# Patient Record
Sex: Female | Born: 1980 | Race: Black or African American | Hispanic: No | Marital: Single | Smoking: Current every day smoker
Health system: Southern US, Community
[De-identification: ages and names within clinical notes are randomized; demographics above are authoritative.]

## PROBLEM LIST (undated history)

## (undated) DIAGNOSIS — I10 Essential (primary) hypertension: Secondary | ICD-10-CM

## (undated) HISTORY — PX: TUBAL LIGATION: SHX77

## (undated) HISTORY — PX: TONSILLECTOMY: SUR1361

## (undated) HISTORY — PX: ABDOMINAL HYSTERECTOMY: SHX81

## (undated) HISTORY — PX: OTHER SURGICAL HISTORY: SHX169

---

## 2002-09-22 HISTORY — PX: TONSILLECTOMY: SUR1361

## 2007-01-04 ENCOUNTER — Ambulatory Visit (HOSPITAL_COMMUNITY): Admission: RE | Admit: 2007-01-04 | Discharge: 2007-01-04 | Payer: Self-pay | Admitting: Obstetrics and Gynecology

## 2010-09-22 HISTORY — PX: ABDOMINAL HYSTERECTOMY: SHX81

## 2010-12-20 ENCOUNTER — Emergency Department (HOSPITAL_BASED_OUTPATIENT_CLINIC_OR_DEPARTMENT_OTHER)
Admission: EM | Admit: 2010-12-20 | Discharge: 2010-12-20 | Disposition: A | Discharge status: Home / Self Care | Payer: Self-pay | Attending: Emergency Medicine | Admitting: Emergency Medicine

## 2010-12-20 DIAGNOSIS — R51 Headache: Secondary | ICD-10-CM | POA: Insufficient documentation

## 2010-12-20 DIAGNOSIS — R109 Unspecified abdominal pain: Secondary | ICD-10-CM | POA: Insufficient documentation

## 2010-12-20 DIAGNOSIS — I10 Essential (primary) hypertension: Secondary | ICD-10-CM | POA: Insufficient documentation

## 2010-12-20 LAB — URINE MICROSCOPIC-ADD ON

## 2010-12-20 LAB — URINALYSIS, ROUTINE W REFLEX MICROSCOPIC
Bilirubin Urine: NEGATIVE
Ketones, ur: 15 mg/dL — AB
Specific Gravity, Urine: 1.026 (ref 1.005–1.030)
Urobilinogen, UA: 0.2 mg/dL (ref 0.0–1.0)

## 2010-12-20 LAB — PREGNANCY, URINE

## 2012-05-29 ENCOUNTER — Encounter (HOSPITAL_BASED_OUTPATIENT_CLINIC_OR_DEPARTMENT_OTHER): Payer: Self-pay | Admitting: *Deleted

## 2012-05-29 ENCOUNTER — Emergency Department (HOSPITAL_BASED_OUTPATIENT_CLINIC_OR_DEPARTMENT_OTHER)
Admission: EM | Admit: 2012-05-29 | Discharge: 2012-05-29 | Disposition: A | Discharge status: Home / Self Care | Payer: Self-pay | Attending: Emergency Medicine | Admitting: Emergency Medicine

## 2012-05-29 DIAGNOSIS — X58XXXA Exposure to other specified factors, initial encounter: Secondary | ICD-10-CM | POA: Insufficient documentation

## 2012-05-29 DIAGNOSIS — F17200 Nicotine dependence, unspecified, uncomplicated: Secondary | ICD-10-CM | POA: Insufficient documentation

## 2012-05-29 DIAGNOSIS — S93402A Sprain of unspecified ligament of left ankle, initial encounter: Secondary | ICD-10-CM

## 2012-05-29 DIAGNOSIS — S96819A Strain of other specified muscles and tendons at ankle and foot level, unspecified foot, initial encounter: Secondary | ICD-10-CM | POA: Insufficient documentation

## 2012-05-29 DIAGNOSIS — S93499A Sprain of other ligament of unspecified ankle, initial encounter: Secondary | ICD-10-CM | POA: Insufficient documentation

## 2012-05-29 DIAGNOSIS — I10 Essential (primary) hypertension: Secondary | ICD-10-CM | POA: Insufficient documentation

## 2012-05-29 HISTORY — DX: Essential (primary) hypertension: I10

## 2012-05-29 MED ORDER — NAPROXEN SODIUM 550 MG PO TABS: 550.0000 mg | ORAL_TABLET | Freq: Two times a day (BID) | ORAL | Status: AC

## 2012-05-29 NOTE — ED Notes (Signed)
Pt states her left ankle has been swollen and painful for about 3 days. Seen at [facility] yesterday and was xrayed "nrl", but BP was also up and "it became more of a BP issue than her ankle.

## 2012-05-29 NOTE — ED Provider Notes (Signed)
History   This chart was scribed for Hanley Seamen, MD scribed by Magnus Sinning. The patient was seen in room MH10/MH10 at 22:10   CSN: [id number]  Arrival date & time 05/29/12  2153     Chief Complaint  Patient presents with  . Ankle Pain    (Consider location/radiation/quality/duration/timing/severity/associated sxs/prior treatment) Patient is a 31 y.o. female presenting with ankle pain. The history is provided by the patient. No language interpreter was used.  Ankle Pain    Kelly Moran is a 31 y.o. female who presents to the Emergency Department complaining of constant moderate medial left ankle pain, onset 3 days  with associated swelling.  Patient states she was seen yesterday at [facility], and x-rayed, which was nml. She says her BP was elevated at the time of visit and so they treated that as primary concern and only gave her Vicodin for treatment of ankle pain. Patient states she has taken three Vicodin today, with no relief and denies injury, or recent falls PCP: none  Past Medical History  Diagnosis Date  . Hypertension     Past Surgical History  Procedure Date  . Tonsillectomy   . Tubal ligation   . Abdominal hysterectomy     History reviewed. No pertinent family history.  History  Substance Use Topics  . Smoking status: Current Everyday Smoker  . Smokeless tobacco: Not on file  . Alcohol Use: No   Review of Systems 10 Systems reviewed and are negative for acute change except as noted in the HPI. Allergies  Review of patient's allergies indicates not on file.  Home Medications   Current Outpatient Rx  Name Route Sig Dispense Refill  . NAPROXEN SODIUM 550 MG PO TABS Oral Take 1 tablet (550 mg total) by mouth 2 (two) times daily with a meal. 30 tablet 0    BP 169/118  Pulse 78  Temp 98.6 F (37 C) (Oral)  Resp 20  Ht 5\' 5"  (1.651 m)  Wt 180 lb (81.647 kg)  BMI 29.95 kg/m2  SpO2 99%  Physical Exam  Nursing note and vitals  reviewed. Constitutional: She is oriented to person, place, and time. She appears well-developed and well-nourished. No distress.  HENT:  Head: Normocephalic and atraumatic.  Eyes: EOM are normal. Pupils are equal, round, and reactive to light.  Neck: Neck supple. No tracheal deviation present.  Cardiovascular: Normal rate.   Pulmonary/Chest: Effort normal. No respiratory distress.  Abdominal: Soft. She exhibits no distension.  Musculoskeletal: Normal range of motion. She exhibits edema and tenderness.       Swelling on lateral left ankle but not tender. Tenderness noted on the medial.   Neurological: She is alert and oriented to person, place, and time. No sensory deficit.  Skin: Skin is warm and dry.  Psychiatric: She has a normal mood and affect. Her behavior is normal.    ED Course  Procedures (including critical care time) DIAGNOSTIC STUDIES: Oxygen Saturation is 99% on room air, normal by my interpretation.    COORDINATION OF CARE:  22:12 Performed physical exam. Provide intent to treat as sprain and recommends taking anti-inflammatory, such as Aleve today.     1. Sprain of left ankle       MDM  I personally performed the services described in this documentation, which was scribed in my presence.  The recorded information has been reviewed and considered.         Hanley Seamen, MD 05/30/12 [phone]

## 2012-05-29 NOTE — ED Notes (Signed)
Pt given d/c instructions by Chanin, RN- ASO applies and crutches given with teaching and return demonstration of correct use- ice pack provided for home use- rx x 1 given for naprosyn

## 2012-05-29 NOTE — ED Notes (Signed)
MD at bedside. 

## 2012-07-02 ENCOUNTER — Emergency Department (HOSPITAL_BASED_OUTPATIENT_CLINIC_OR_DEPARTMENT_OTHER)
Admission: EM | Admit: 2012-07-02 | Discharge: 2012-07-02 | Disposition: A | Discharge status: Home / Self Care | Payer: Self-pay | Attending: Emergency Medicine | Admitting: Emergency Medicine

## 2012-07-02 ENCOUNTER — Encounter (HOSPITAL_BASED_OUTPATIENT_CLINIC_OR_DEPARTMENT_OTHER): Payer: Self-pay | Admitting: Emergency Medicine

## 2012-07-02 DIAGNOSIS — F17200 Nicotine dependence, unspecified, uncomplicated: Secondary | ICD-10-CM | POA: Insufficient documentation

## 2012-07-02 DIAGNOSIS — I10 Essential (primary) hypertension: Secondary | ICD-10-CM | POA: Insufficient documentation

## 2012-07-02 DIAGNOSIS — R51 Headache: Secondary | ICD-10-CM | POA: Insufficient documentation

## 2012-07-02 MED ORDER — IBUPROFEN 800 MG PO TABS
800.0000 mg | ORAL_TABLET | Freq: Once | ORAL | Status: AC
  Administered 2012-07-02: 800 mg via ORAL
  Filled 2012-07-02: qty 1

## 2012-07-02 MED ORDER — OXYCODONE-ACETAMINOPHEN 5-325 MG PO TABS: 2.0000 | ORAL_TABLET | ORAL | Status: DC | PRN

## 2012-07-02 MED ORDER — DIPHENHYDRAMINE HCL 50 MG/ML IJ SOLN
25.0000 mg | Freq: Once | INTRAMUSCULAR | Status: AC
  Administered 2012-07-02: 25 mg via INTRAVENOUS
  Filled 2012-07-02: qty 1

## 2012-07-02 MED ORDER — HYDROMORPHONE HCL PF 1 MG/ML IJ SOLN
1.0000 mg | Freq: Once | INTRAMUSCULAR | Status: AC
  Administered 2012-07-02: 1 mg via INTRAVENOUS
  Filled 2012-07-02: qty 1

## 2012-07-02 MED ORDER — PROMETHAZINE HCL 25 MG PO TABS: 25.0000 mg | ORAL_TABLET | Freq: Four times a day (QID) | ORAL | Status: DC | PRN

## 2012-07-02 NOTE — ED Provider Notes (Signed)
History     CSN: [id number]  Arrival date & time 07/02/12  1727   First MD Initiated Contact with Patient 07/02/12 1755      Chief Complaint  Patient presents with  . Headache    (Consider location/radiation/quality/duration/timing/severity/associated sxs/prior treatment) HPI Comments: Patient is a 31 year old female with a history of hypertension who presents with a headache that started gradually 2 days ago. The headache started gradually and progressively worsened to severe, throbbing pain. The pain is located all over her head and radiates down her right neck and right upper back. She reports trying excedrin and tylenol for her headache without relief. She reports worsening headache with bright lights and loud noises. She denies alleviating factors. She reports associated nausea. She denies fever, vomiting, abdominal pain, SOB, chest pain, numbness/tingling or extremities. She denies head injury.    Past Medical History  Diagnosis Date  . Hypertension     Past Surgical History  Procedure Date  . Tonsillectomy   . Tubal ligation   . Abdominal hysterectomy     No family history on file.  History  Substance Use Topics  . Smoking status: Current Every Day Smoker  . Smokeless tobacco: Not on file  . Alcohol Use: No    OB History    Grav Para Term Preterm Abortions TAB SAB Ect Mult Living                  Review of Systems  HENT: Positive for neck pain.   Eyes: Positive for photophobia.  Gastrointestinal: Positive for nausea.  Musculoskeletal: Positive for back pain.  Neurological: Positive for headaches.  All other systems reviewed and are negative.    Allergies  Lisinopril  Home Medications   Current Outpatient Rx  Name Route Sig Dispense Refill  . CLONIDINE HCL 0.1 MG PO TABS Oral Take 0.1 mg by mouth 1 day or 1 dose.    [name] Kitchen METOPROLOL-HYDROCHLOROTHIAZIDE 100-25 MG PO TABS Oral Take 1 tablet by mouth daily.    [name] Kitchen NAPROXEN SODIUM 550 MG PO TABS Oral Take  1 tablet (550 mg total) by mouth 2 (two) times daily with a meal. 30 tablet 0    BP 194/126  Pulse 82  Temp 98.5 F (36.9 C) (Oral)  Resp 20  Ht 5\' 5"  (1.651 m)  Wt 190 lb (86.183 kg)  BMI 31.62 kg/m2  Physical Exam  Nursing note and vitals reviewed. Constitutional: She is oriented to person, place, and time. She appears well-developed and well-nourished. No distress.  HENT:  Head: Normocephalic and atraumatic.  Eyes: Conjunctivae normal and EOM are normal. Pupils are equal, round, and reactive to light. No scleral icterus.       Patient expresses sensitivity to light.   Neck: Normal range of motion. Neck supple.  Cardiovascular: Normal rate and regular rhythm.  Exam reveals no gallop and no friction rub.   No murmur heard. Pulmonary/Chest: Effort normal and breath sounds normal. She has no wheezes. She has no rales. She exhibits no tenderness.  Abdominal: Soft. There is no tenderness.  Musculoskeletal: Normal range of motion.  Neurological: She is alert and oriented to person, place, and time. No cranial nerve deficit. Coordination normal.       Extremity strength and sensation equal and intact bilaterally. Speech is goal-oriented. Moves limbs without ataxia.   Skin: Skin is warm and dry. She is not diaphoretic.  Psychiatric: She has a normal mood and affect. Her behavior is normal.    ED  Course  Procedures (including critical care time)  Labs Reviewed - No data to display No results found.   1. Headache       MDM  6:30 PM Patient has a migraine that is worse than usual. I will give her a migraine cocktail and reassess her.   8:02 PM Patient reports resolution of her headache with migraine cocktail. I discussed with the patient about following up with a low cost medical provider in the area because the patient does not have a PCP. We also discussed talking to a medical provider about altering HTN medication due to high BP noted at today's visit despite taking  medication as directed. Patient is agreeable to plan. No further evaluation needed at this time.       Emilia Beck, PA-C 07/02/12 2214

## 2012-07-02 NOTE — ED Notes (Signed)
Headache rt side x 2 days  Now rt shoulder ,necj and upper back

## 2012-07-02 NOTE — ED Provider Notes (Signed)
Medical screening examination/treatment/procedure(s) were performed by non-physician practitioner and as supervising physician I was immediately available for consultation/collaboration.   Barth Trella, MD 07/02/12 2347 

## 2012-10-19 ENCOUNTER — Encounter (HOSPITAL_BASED_OUTPATIENT_CLINIC_OR_DEPARTMENT_OTHER): Payer: Self-pay

## 2012-10-19 ENCOUNTER — Emergency Department (HOSPITAL_BASED_OUTPATIENT_CLINIC_OR_DEPARTMENT_OTHER)
Admission: EM | Admit: 2012-10-19 | Discharge: 2012-10-19 | Disposition: A | Discharge status: Home / Self Care | Payer: Self-pay | Attending: Emergency Medicine | Admitting: Emergency Medicine

## 2012-10-19 DIAGNOSIS — Z79899 Other long term (current) drug therapy: Secondary | ICD-10-CM | POA: Insufficient documentation

## 2012-10-19 DIAGNOSIS — Z3202 Encounter for pregnancy test, result negative: Secondary | ICD-10-CM | POA: Insufficient documentation

## 2012-10-19 DIAGNOSIS — I10 Essential (primary) hypertension: Secondary | ICD-10-CM | POA: Insufficient documentation

## 2012-10-19 DIAGNOSIS — F17200 Nicotine dependence, unspecified, uncomplicated: Secondary | ICD-10-CM | POA: Insufficient documentation

## 2012-10-19 DIAGNOSIS — R51 Headache: Secondary | ICD-10-CM | POA: Insufficient documentation

## 2012-10-19 LAB — URINALYSIS, ROUTINE W REFLEX MICROSCOPIC
Bilirubin Urine: NEGATIVE
Nitrite: NEGATIVE
Protein, ur: NEGATIVE mg/dL
Urobilinogen, UA: 1 mg/dL (ref 0.0–1.0)

## 2012-10-19 LAB — URINE MICROSCOPIC-ADD ON

## 2012-10-19 LAB — PREGNANCY, URINE: Preg Test, Ur: NEGATIVE

## 2012-10-19 MED ORDER — DIPHENHYDRAMINE HCL 50 MG/ML IJ SOLN
25.0000 mg | Freq: Once | INTRAMUSCULAR | Status: AC
  Administered 2012-10-19: 25 mg via INTRAVENOUS
  Filled 2012-10-19: qty 1

## 2012-10-19 MED ORDER — METOPROLOL-HYDROCHLOROTHIAZIDE 100-25 MG PO TABS: 1.0000 | ORAL_TABLET | Freq: Every day | ORAL | Status: DC

## 2012-10-19 MED ORDER — SODIUM CHLORIDE 0.9 % IV SOLN
Freq: Once | INTRAVENOUS | Status: AC
  Administered 2012-10-19: 22:00:00 via INTRAVENOUS

## 2012-10-19 MED ORDER — METOCLOPRAMIDE HCL 5 MG/ML IJ SOLN
10.0000 mg | Freq: Once | INTRAMUSCULAR | Status: AC
  Administered 2012-10-19: 10 mg via INTRAVENOUS
  Filled 2012-10-19: qty 2

## 2012-10-19 MED ORDER — KETOROLAC TROMETHAMINE 30 MG/ML IJ SOLN
30.0000 mg | Freq: Once | INTRAMUSCULAR | Status: AC
  Administered 2012-10-19: 30 mg via INTRAVENOUS
  Filled 2012-10-19: qty 1

## 2012-10-19 NOTE — ED Provider Notes (Signed)
Medical screening examination/treatment/procedure(s) were performed by non-physician practitioner and as supervising physician I was immediately available for consultation/collaboration.   Dione Booze, MD 10/19/12 2227

## 2012-10-19 NOTE — ED Provider Notes (Signed)
History     CSN: [id number]  Arrival date & time 10/19/12  2053   First MD Initiated Contact with Patient 10/19/12 2123      Chief Complaint  Patient presents with  . Generalized Body Aches    (Consider location/radiation/quality/duration/timing/severity/associated sxs/prior treatment) Patient is a 32 y.o. female presenting with fever. The history is provided by the patient. No language interpreter was used.  Fever Primary symptoms of the febrile illness include fever and nausea. Primary symptoms do not include cough. The current episode started today. This is a new problem. The problem has not changed since onset. Nausea began today.  Associated with: high blood pressure. Risk factors: high blood pressure.   Past Medical History  Diagnosis Date  . Hypertension     Past Surgical History  Procedure Date  . Tonsillectomy   . Tubal ligation   . Abdominal hysterectomy     No family history on file.  History  Substance Use Topics  . Smoking status: Current Every Day Smoker  . Smokeless tobacco: Not on file  . Alcohol Use: No    OB History    Grav Para Term Preterm Abortions TAB SAB Ect Mult Living                  Review of Systems  Constitutional: Positive for fever.  Respiratory: Negative for cough.   Gastrointestinal: Positive for nausea.  All other systems reviewed and are negative.    Allergies  Aspirin and Lisinopril  Home Medications   Current Outpatient Rx  Name  Route  Sig  Dispense  Refill  . CLONIDINE HCL 0.1 MG PO TABS   Oral   Take 0.1 mg by mouth 1 day or 1 dose.         [name] Kitchen METOPROLOL-HYDROCHLOROTHIAZIDE 100-25 MG PO TABS   Oral   Take 1 tablet by mouth daily.         [name] Kitchen NAPROXEN SODIUM 550 MG PO TABS   Oral   Take 1 tablet (550 mg total) by mouth 2 (two) times daily with a meal.   30 tablet   0   . OXYCODONE-ACETAMINOPHEN 5-325 MG PO TABS   Oral   Take 2 tablets by mouth every 4 (four) hours as needed for pain.   15 tablet   0   . PROMETHAZINE HCL 25 MG PO TABS   Oral   Take 1 tablet (25 mg total) by mouth every 6 (six) hours as needed for nausea.   12 tablet   0     BP 183/113  Pulse 85  Temp 98.8 F (37.1 C) (Oral)  Resp 20  Ht 5\' 5"  (1.651 m)  Wt 200 lb (90.719 kg)  BMI 33.28 kg/m2  SpO2 100%  Physical Exam  Nursing note and vitals reviewed. Constitutional: She is oriented to person, place, and time. She appears well-developed and well-nourished.  HENT:  Head: Normocephalic and atraumatic.  Right Ear: External ear normal.  Left Ear: External ear normal.  Nose: Nose normal.  Mouth/Throat: Oropharynx is clear and moist.  Eyes: Pupils are equal, round, and reactive to light.  Neck: Normal range of motion.  Cardiovascular: Normal rate.   Pulmonary/Chest: Effort normal.  Abdominal: Soft.  Musculoskeletal: Normal range of motion.  Neurological: She is alert and oriented to person, place, and time.  Skin: Skin is warm.  Psychiatric: She has a normal mood and affect.    ED Course  Procedures (including critical care time)  Labs Reviewed -  No data to display No results found.   No diagnosis found.    MDM   Results for orders placed during the hospital encounter of 10/19/12  PREGNANCY, URINE      Component Value Range   Preg Test, Ur NEGATIVE  NEGATIVE  URINALYSIS, ROUTINE W REFLEX MICROSCOPIC      Component Value Range   Color, Urine YELLOW  YELLOW   APPearance CLEAR  CLEAR   Specific Gravity, Urine 1.030  1.005 - 1.030   pH 5.5  5.0 - 8.0   Glucose, UA NEGATIVE  NEGATIVE mg/dL   Hgb urine dipstick MODERATE (*) NEGATIVE   Bilirubin Urine NEGATIVE  NEGATIVE   Ketones, ur NEGATIVE  NEGATIVE mg/dL   Protein, ur NEGATIVE  NEGATIVE mg/dL   Urobilinogen, UA 1.0  0.0 - 1.0 mg/dL   Nitrite NEGATIVE  NEGATIVE   Leukocytes, UA NEGATIVE  NEGATIVE  URINE MICROSCOPIC-ADD ON      Component Value Range   Squamous Epithelial / LPF FEW (*) RARE   WBC, UA 0-2  <3 WBC/hpf   RBC / HPF  7-10  <3 RBC/hpf   Bacteria, UA RARE  RARE   Urine-Other MUCOUS PRESENT     No results found.    Pt given rx for lopressor hct.    Pt given torodol, reglan and benadryl   Pt reports she feels better, headache resolved  Elson Areas, [place] 10/19/12 2205  Lonia Skinner [place], [place] 10/19/12 2205  Lonia Skinner [place], [place] 10/19/12 2215  Lonia Skinner [place], [place] 10/19/12 2216

## 2012-10-19 NOTE — ED Notes (Signed)
C/o "pain to my bones", HA x today

## 2016-09-09 ENCOUNTER — Encounter (HOSPITAL_COMMUNITY): Payer: Self-pay

## 2016-09-09 ENCOUNTER — Emergency Department (HOSPITAL_COMMUNITY): Payer: Medicaid Other

## 2016-09-09 ENCOUNTER — Emergency Department (HOSPITAL_COMMUNITY)
Admission: EM | Admit: 2016-09-09 | Discharge: 2016-09-09 | Disposition: A | Discharge status: Home / Self Care | Payer: Medicaid Other | Attending: Emergency Medicine | Admitting: Emergency Medicine

## 2016-09-09 DIAGNOSIS — M7989 Other specified soft tissue disorders: Secondary | ICD-10-CM | POA: Diagnosis present

## 2016-09-09 DIAGNOSIS — F17200 Nicotine dependence, unspecified, uncomplicated: Secondary | ICD-10-CM | POA: Insufficient documentation

## 2016-09-09 DIAGNOSIS — M25462 Effusion, left knee: Secondary | ICD-10-CM | POA: Diagnosis not present

## 2016-09-09 DIAGNOSIS — M25562 Pain in left knee: Secondary | ICD-10-CM

## 2016-09-09 DIAGNOSIS — I10 Essential (primary) hypertension: Secondary | ICD-10-CM | POA: Insufficient documentation

## 2016-09-09 LAB — SYNOVIAL CELL COUNT + DIFF, W/ CRYSTALS
Crystals, Fluid: NONE SEEN
Lymphocytes-Synovial Fld: 2 % (ref 0–20)
Monocyte-Macrophage-Synovial Fluid: 21 % — ABNORMAL LOW (ref 50–90)
NEUTROPHIL, SYNOVIAL: 77 % — AB (ref 0–25)
WBC, Synovial: 8340 /mm3 — ABNORMAL HIGH (ref 0–200)

## 2016-09-09 LAB — GRAM STAIN

## 2016-09-09 MED ORDER — LIDOCAINE HCL (PF) 1 % IJ SOLN
5.0000 mL | Freq: Once | INTRAMUSCULAR | Status: AC
  Administered 2016-09-09: 5 mL via INTRADERMAL
  Filled 2016-09-09: qty 5

## 2016-09-09 MED ORDER — ACETAMINOPHEN 500 MG PO TABS
1000.0000 mg | ORAL_TABLET | Freq: Once | ORAL | Status: AC
  Administered 2016-09-09: 1000 mg via ORAL
  Filled 2016-09-09: qty 2

## 2016-09-09 NOTE — ED Notes (Signed)
EDP at bedside draining patient's knee.

## 2016-09-09 NOTE — ED Notes (Signed)
Pt returned to room from xray.

## 2016-09-09 NOTE — ED Notes (Signed)
Pt upset that it is taking so long to see doctor and have knee drained. Patient advised of doctor coming soon.

## 2016-09-09 NOTE — ED Notes (Signed)
Patient transported to X-ray 

## 2016-09-09 NOTE — ED Notes (Signed)
Pt given ice pack for left knee swelling.  Elevated knee.

## 2016-09-09 NOTE — ED Notes (Signed)
Pt ambulated to bathroom with crutch and standby assist.

## 2016-09-09 NOTE — ED Notes (Signed)
Sitter at bedside.

## 2016-09-09 NOTE — ED Provider Notes (Addendum)
[facility]-EMERGENCY DEPT Provider Note   CSN: [id number] Arrival date & time: 09/09/16  1018     History   Chief Complaint Chief Complaint  Patient presents with  . Knee Injury    HPI Kelly Moran is a 35 y.o. female.  HPI 35 year old female with a history of hypertension and prior left knee injury presents to the ED with 1 week of worsening left knee swelling and pain. She reports that she had a meniscal injury 3 years ago and since then she has had recurrent pain and swelling of that knee. He has been approximately one year since her last episode. Pain is exacerbated with weightbearing and range of motion. She denies any recent trauma or instrumentation. Denies any history of IV drug use or STDs. She reports that orthopedic surgery injected medicine into joint several years ago to improve the pain. Last year she had fluid taken out which significantly improved her symptoms and she did not need to follow-up with orthopedic surgery   She denies any other physical complaints at this time.    Past Medical History:  Diagnosis Date  . Hypertension     There are no active problems to display for this patient.   Past Surgical History:  Procedure Laterality Date  . ABDOMINAL HYSTERECTOMY    . TONSILLECTOMY    . TUBAL LIGATION      OB History    No data available       Home Medications    Prior to Admission medications   Medication Sig Start Date End Date Taking? Authorizing Provider  amLODipine (NORVASC) 10 MG tablet Take 10 mg by mouth daily.   Yes Historical Provider, MD  cloNIDine (CATAPRES) 0.1 MG tablet Take 0.1 mg by mouth 3 (three) times daily.    Yes Historical Provider, MD  metoprolol-hydrochlorothiazide (LOPRESSOR HCT) 100-25 MG per tablet Take 1 tablet by mouth daily. 10/19/12  Yes Elson Areas, PA-C  oxyCODONE-acetaminophen (PERCOCET/ROXICET) 5-325 MG per tablet Take 2 tablets by mouth every 4 (four) hours as needed for pain. Patient not taking: Reported on  09/09/2016 07/02/12   Emilia Beck, PA-C  promethazine (PHENERGAN) 25 MG tablet Take 1 tablet (25 mg total) by mouth every 6 (six) hours as needed for nausea. Patient not taking: Reported on 09/09/2016 07/02/12   Emilia Beck, PA-C    Family History No family history on file.  Social History Social History  Substance Use Topics  . Smoking status: Current Every Day Smoker    Packs/day: 0.50  . Smokeless tobacco: Never Used  . Alcohol use Yes     Comment: Occasional     Allergies   Aspirin and Lisinopril   Review of Systems Review of Systems Ten systems are reviewed and are negative for acute change except as noted in the HPI   Physical Exam Updated Vital Signs BP (!) 170/130 (BP Location: Left Arm)   Pulse 98   Temp 97.8 F (36.6 C) (Oral)   Resp 18   Ht 5\' 5"  (1.651 m)   Wt 200 lb (90.7 kg)   SpO2 100%   BMI 33.28 kg/m   Physical Exam  Constitutional: She is oriented to person, place, and time. She appears well-developed and well-nourished. No distress.  HENT:  Head: Normocephalic and atraumatic.  Nose: Nose normal.  Eyes: Conjunctivae and EOM are normal. Pupils are equal, round, and reactive to light. Right eye exhibits no discharge. Left eye exhibits no discharge. No scleral icterus.  Neck: Normal range of  motion. Neck supple.  Cardiovascular: Normal rate and regular rhythm.  Exam reveals no gallop and no friction rub.   No murmur heard. Pulmonary/Chest: Effort normal and breath sounds normal. No stridor. No respiratory distress. She has no rales.  Abdominal: Soft. She exhibits no distension. There is no tenderness.  Musculoskeletal: She exhibits no edema or tenderness.       Left knee: She exhibits decreased range of motion, swelling and effusion. She exhibits no erythema.  Neurological: She is alert and oriented to person, place, and time.  Skin: Skin is warm and dry. No rash noted. She is not diaphoretic. No erythema.  Psychiatric: She has a  normal mood and affect.  Vitals reviewed.    ED Treatments / Results  Labs (all labs ordered are listed, but only abnormal results are displayed) Labs Reviewed  SYNOVIAL CELL COUNT + DIFF, W/ CRYSTALS - Abnormal; Notable for the following:       Result Value   Color, Synovial RED (*)    Appearance-Synovial CLOUDY (*)    WBC, Synovial 8,340 (*)    Neutrophil, Synovial 77 (*)    Monocyte-Macrophage-Synovial Fluid 21 (*)    All other components within normal limits  GRAM STAIN  CULTURE, BODY FLUID-BOTTLE    EKG  EKG Interpretation None       Radiology Dg Knee Complete 4 Views Left  Result Date: 09/09/2016 CLINICAL DATA:  Pain and swelling EXAM: LEFT KNEE - COMPLETE 4+ VIEW COMPARISON:  December 10, 2015 FINDINGS: Frontal, lateral, tunnel, and bilateral oblique views were obtained. There is no fracture or dislocation. There is a joint effusion. There is mild narrowing medially. Other joint spaces appear normal. No erosive change. IMPRESSION: Joint effusion present, also present on prior study. Mild joint space narrowing medially. No fracture or dislocation. Electronically Signed   By: Bretta BangWilliam  Woodruff III M.D.   On: 09/09/2016 11:13    Procedures .Joint Aspiration/Arthrocentesis Date/Time: 09/09/2016 3:32 PM Performed by: Nira ConnARDAMA, Breyson Kelm EDUARDO Authorized by: Nira ConnARDAMA, Tsugio Elison EDUARDO   Consent:    Consent obtained:  Verbal   Consent given by:  Patient   Risks discussed:  Infection, pain and incomplete drainage   Alternatives discussed:  No treatment Location:    Location:  Knee   Knee:  L knee Anesthesia (see MAR for exact dosages):    Anesthesia method:  Local infiltration   Local anesthetic:  Lidocaine 1% w/o epi Procedure details:    Needle gauge:  18 G   Ultrasound guidance: no     Approach:  Lateral   Aspirate amount:  60 cc   Aspirate characteristics:  Blood-tinged   Steroid injected: no   Post-procedure details:    Dressing:  Adhesive bandage   Patient  tolerance of procedure:  Tolerated well, no immediate complications   (including critical care time)  Medications Ordered in ED Medications  lidocaine (PF) (XYLOCAINE) 1 % injection 5 mL (5 mLs Intradermal Given 09/09/16 1240)  acetaminophen (TYLENOL) tablet 1,000 mg (1,000 mg Oral Given 09/09/16 1356)     Initial Impression / Assessment and Plan / ED Course  I have reviewed the triage vital signs and the nursing notes.  Pertinent labs & imaging results that were available during my care of the patient were reviewed by me and considered in my medical decision making (see chart for details).  Clinical Course    Triage plain film negative for any acute injuries. Did note knee effusion. Low suspicion for DVT. Low suspicion for septic arthritis. We'll  perform a joint aspiration for symptomatic treatment and will send for analysis to confirm likely inflammatory process. Significant improvement in patient's pain and range of motion following therapeutic aspiration.  Patient with a history of hypertension with elevated blood pressures. Asymptomatic.  Patient requested to leave prior to synovial analysis results because she had to pick up her daughter from school.  I will follow synovial analysis results and contact the patient if needed for additional treatment.  The patient is safe for discharge with strict return precautions.   Final Clinical Impressions(s) / ED Diagnoses   Final diagnoses:  Effusion of left knee  Acute pain of left knee   Disposition: Discharge  Condition: Good  I have discussed the results, Dx and Tx plan with the patient who expressed understanding and agree(s) with the plan. Discharge instructions discussed at great length. The patient was given strict return precautions who verbalized understanding of the instructions. No further questions at time of discharge.    Discharge Medication List as of 09/09/2016  3:31 PM      Follow Up: [facility] [address] [place] [id number] [phone]  Schedule an appointment as soon as possible for a visit  in 5-7 days, If symptoms do not improve or  worsen   Addendum: Synovial fluid results returned showing no evidence of organisms on Gram stain. Cell counts consistent with inflammatory/reactive disease. No evidence of gout or pseudogout noted.  Will follow cultures to ensure no growth.   Nira ConnPedro Eduardo Trai Ells, MD 09/09/16 1713    Nira ConnPedro Eduardo Ranae Casebier, MD 09/09/16 [phone]

## 2016-09-09 NOTE — ED Triage Notes (Signed)
Per Pt, Pt is coming from home with complaint of left leg swelling that started yesterday morning. Pt reports Hx of meniscus surgery a couple years ago. Doesn't report knee injury at this time, but report reoccurring swelling. Hx of HTN

## 2016-09-14 LAB — CULTURE, BODY FLUID W GRAM STAIN -BOTTLE: Culture: NO GROWTH

## 2016-09-14 LAB — CULTURE, BODY FLUID-BOTTLE

## 2016-10-28 ENCOUNTER — Emergency Department (HOSPITAL_BASED_OUTPATIENT_CLINIC_OR_DEPARTMENT_OTHER)
Admission: EM | Admit: 2016-10-28 | Discharge: 2016-10-28 | Disposition: A | Discharge status: Home / Self Care | Payer: Medicaid Other | Attending: Emergency Medicine | Admitting: Emergency Medicine

## 2016-10-28 ENCOUNTER — Emergency Department (HOSPITAL_BASED_OUTPATIENT_CLINIC_OR_DEPARTMENT_OTHER): Payer: Medicaid Other

## 2016-10-28 ENCOUNTER — Encounter (HOSPITAL_BASED_OUTPATIENT_CLINIC_OR_DEPARTMENT_OTHER): Payer: Self-pay | Admitting: Emergency Medicine

## 2016-10-28 DIAGNOSIS — Z23 Encounter for immunization: Secondary | ICD-10-CM | POA: Diagnosis not present

## 2016-10-28 DIAGNOSIS — F17210 Nicotine dependence, cigarettes, uncomplicated: Secondary | ICD-10-CM | POA: Insufficient documentation

## 2016-10-28 DIAGNOSIS — I10 Essential (primary) hypertension: Secondary | ICD-10-CM | POA: Diagnosis not present

## 2016-10-28 DIAGNOSIS — M25462 Effusion, left knee: Secondary | ICD-10-CM | POA: Diagnosis not present

## 2016-10-28 DIAGNOSIS — Z79899 Other long term (current) drug therapy: Secondary | ICD-10-CM | POA: Insufficient documentation

## 2016-10-28 DIAGNOSIS — M25562 Pain in left knee: Secondary | ICD-10-CM | POA: Diagnosis present

## 2016-10-28 MED ORDER — TRAMADOL HCL 50 MG PO TABS: 50.0000 mg | ORAL_TABLET | Freq: Four times a day (QID) | ORAL | 0 refills | Status: DC | PRN

## 2016-10-28 MED ORDER — LIDOCAINE-EPINEPHRINE 2 %-1:100000 IJ SOLN: 20.0000 mL | Freq: Once | INTRAMUSCULAR | Status: DC

## 2016-10-28 MED ORDER — LIDOCAINE-EPINEPHRINE 1 %-1:100000 IJ SOLN
INTRAMUSCULAR | Status: AC
  Filled 2016-10-28: qty 1

## 2016-10-28 MED ORDER — TETANUS-DIPHTH-ACELL PERTUSSIS 5-2.5-18.5 LF-MCG/0.5 IM SUSP
0.5000 mL | Freq: Once | INTRAMUSCULAR | Status: AC
  Administered 2016-10-28: 0.5 mL via INTRAMUSCULAR
  Filled 2016-10-28: qty 0.5

## 2016-10-28 MED FILL — traMADol HCL 50 MG TABS: 50 | 4 days supply | Qty: 15 | Fill #0

## 2016-10-28 NOTE — ED Provider Notes (Signed)
[facility] Provider Note   CSN: [id number] Arrival date & time: 10/28/16  1448     History   Chief Complaint Chief Complaint  Patient presents with  . Joint Swelling    HPI Kelly Moran is a 36 y.o. female.  HPI   36 year old female presenting complaining of left knee pain. Patient states she injured her left knee several years ago and since then she has had recurrent knee swelling. She is having increasing swelling for the past weeks which she described as an achy throbbing sensation, worsening with ambulation. She denies having associated fever, numbness, hip or ankle pain. She denies any recent injury. No specific treatment tried. She has had fluid draws off her knee several times in the past usually in the ER because she does not have insurance and has not follow up with an orthopedist. No prior history of septic joint. She is not up-to-date with tetanus.  Past Medical History:  Diagnosis Date  . Hypertension     There are no active problems to display for this patient.   Past Surgical History:  Procedure Laterality Date  . ABDOMINAL HYSTERECTOMY    . TONSILLECTOMY    . TUBAL LIGATION      OB History    No data available       Home Medications    Prior to Admission medications   Medication Sig Start Date End Date Taking? Authorizing Provider  amLODipine (NORVASC) 10 MG tablet Take 10 mg by mouth daily.    Historical Provider, MD  cloNIDine (CATAPRES) 0.1 MG tablet Take 0.1 mg by mouth 3 (three) times daily.     Historical Provider, MD  metoprolol-hydrochlorothiazide (LOPRESSOR HCT) 100-25 MG per tablet Take 1 tablet by mouth daily. 10/19/12   Elson Areas, PA-C  oxyCODONE-acetaminophen (PERCOCET/ROXICET) 5-325 MG per tablet Take 2 tablets by mouth every 4 (four) hours as needed for pain. Patient not taking: Reported on 09/09/2016 07/02/12   Emilia Beck, PA-C  promethazine (PHENERGAN) 25 MG tablet Take 1 tablet (25 mg total) by mouth  every 6 (six) hours as needed for nausea. Patient not taking: Reported on 09/09/2016 07/02/12   Emilia Beck, PA-C    Family History History reviewed. No pertinent family history.  Social History Social History  Substance Use Topics  . Smoking status: Current Every Day Smoker    Packs/day: 0.50  . Smokeless tobacco: Never Used  . Alcohol use Yes     Comment: Occasional     Allergies   Aspirin and Lisinopril   Review of Systems Review of Systems  Constitutional: Negative for fever.  Musculoskeletal: Positive for joint swelling.  Skin: Negative for rash and wound.  Neurological: Negative for numbness.     Physical Exam Updated Vital Signs BP (!) 183/127 (BP Location: Right Arm)   Pulse 102   Temp 98.7 F (37.1 C) (Oral)   Resp 18   Ht 5\' 5"  (1.651 m)   Wt 90.7 kg   SpO2 100%   BMI 33.28 kg/m   Physical Exam  Constitutional: She appears well-developed and well-nourished. No distress.  HENT:  Head: Atraumatic.  Eyes: Conjunctivae are normal.  Neck: Neck supple.  Musculoskeletal: She exhibits tenderness (Left knee: Knee is edematous, tender to palpation, with decreased knee flexion extension secondary to pain but no gross deformity. No warmth, and no erythema. Left hip and left ankle is nontender.).  Neurological: She is alert.  Skin: No rash noted.  Psychiatric: She has a normal mood  and affect.  Nursing note and vitals reviewed.    ED Treatments / Results  Labs (all labs ordered are listed, but only abnormal results are displayed) Labs Reviewed - No data to display  EKG  EKG Interpretation None       Radiology Dg Knee Complete 4 Views Left  Result Date: 10/28/2016 CLINICAL DATA:  Patellar pain with swelling.  No recent injury. EXAM: LEFT KNEE - COMPLETE 4+ VIEW COMPARISON:  09/09/2016 FINDINGS: No fracture. No subluxation or dislocation. Medial joint space narrowing noted. Trace hypertrophic spurring visible all 3 compartments. Fluid visible  in the suprapatellar bursa. IMPRESSION: Mild tricompartmental degenerative spurring with joint effusion. Electronically Signed   By: Kennith Center M.D.   On: 10/28/2016 15:21    Procedures .Joint Aspiration/Arthrocentesis Date/Time: 10/28/2016 4:38 PM Performed by: Fayrene Helper Authorized by: Fayrene Helper   Consent:    Consent obtained:  Verbal and written   Consent given by:  Patient   Risks discussed:  Pain   Alternatives discussed:  No treatment, alternative treatment and referral Location:    Location:  Knee   Knee:  L knee Anesthesia (see MAR for exact dosages):    Anesthesia method:  Local infiltration   Local anesthetic:  Lidocaine 1% WITH epi Procedure details:    Preparation: Patient was prepped and draped in usual sterile fashion     Needle gauge:  18 G   Ultrasound guidance: no     Approach:  Lateral   Aspirate characteristics:  Blood-tinged   Steroid injected: no     Specimen collected: no   Post-procedure details:    Dressing:  Adhesive bandage   Patient tolerance of procedure:  Tolerated well, no immediate complications Comments:     A total of 85cc of blood tinged synovial fluid was aspirated from L knee. Pt tolerates well and felt better.     (including critical care time)    Medications Ordered in ED Medications  lidocaine-EPINEPHrine (XYLOCAINE W/EPI) 2 %-1:100000 (with pres) injection 20 mL (not administered)  lidocaine-EPINEPHrine (XYLOCAINE W/EPI) 1 %-1:100000 (with pres) injection (not administered)  Tdap (BOOSTRIX) injection 0.5 mL (0.5 mLs Intramuscular Given 10/28/16 1612)     Initial Impression / Assessment and Plan / ED Course  I have reviewed the triage vital signs and the nursing notes.  Pertinent labs & imaging results that were available during my care of the patient were reviewed by me and considered in my medical decision making (see chart for details).     BP (!) 183/127 (BP Location: Right Arm)   Pulse 102   Temp 98.7 F (37.1 C)  (Oral)   Resp 18   Ht 5\' 5"  (1.651 m)   Wt 90.7 kg   SpO2 100%   BMI 33.28 kg/m  The patient was noted to be hypertensive today in the emergency department. I have spoken with the patient regarding hypertension and the need for improved management. I instructed the patient to followup with the Primary care doctor within 4 days to improve the management of the patient's hypertension. I also counseled the patient regarding the signs and symptoms which would require an emergent visit to an emergency department for hypertensive urgency and/or hypertensive emergency. The patient understood the need for improved hypertensive management.   Final Clinical Impressions(s) / ED Diagnoses   Final diagnoses:  Effusion of left knee    New Prescriptions New Prescriptions   TRAMADOL (ULTRAM) 50 MG TABLET    Take 1 tablet (50 mg total) by  mouth every 6 (six) hours as needed.   4:01 PM Patient here with left knee swelling and an x-ray suggesting joint effusion and suprapatellar bursa inflammation. She does not have any erythema to suggest cellulitis or septic joint. She is overweight as it can contribute to her presenting symptom. Patient is requesting for knee aspiration for comfort.  4:39 PM Successful L knee aspiration with 85cc of blood tinged synovial fluid aspirated.  Pt felt better. ACE wrap applied, pain medication given, ortho referral given.  Return precaution discussed.    Fayrene HelperBowie Hezakiah Champeau, PA-C 10/28/16 1640    Charlynne Panderavid Hsienta Yao, MD 10/28/16 [phone]

## 2016-10-28 NOTE — ED Triage Notes (Signed)
Patient states that she had an injury to her left knee in the past. She intermittently needs to have it drained. Reports that the swelling returned last night

## 2016-10-28 NOTE — ED Notes (Signed)
ED Provider at bedside. 

## 2017-01-07 DIAGNOSIS — G8929 Other chronic pain: Secondary | ICD-10-CM | POA: Insufficient documentation

## 2017-01-07 HISTORY — DX: Other chronic pain: G89.29

## 2017-03-04 DIAGNOSIS — B351 Tinea unguium: Secondary | ICD-10-CM

## 2017-03-04 DIAGNOSIS — B353 Tinea pedis: Secondary | ICD-10-CM | POA: Insufficient documentation

## 2017-03-04 HISTORY — DX: Tinea unguium: B35.1

## 2017-03-04 HISTORY — DX: Tinea pedis: B35.3

## 2017-04-07 DIAGNOSIS — I10 Essential (primary) hypertension: Secondary | ICD-10-CM | POA: Diagnosis not present

## 2017-04-07 DIAGNOSIS — M009 Pyogenic arthritis, unspecified: Secondary | ICD-10-CM | POA: Diagnosis not present

## 2017-04-08 DIAGNOSIS — I10 Essential (primary) hypertension: Secondary | ICD-10-CM | POA: Diagnosis not present

## 2017-04-08 DIAGNOSIS — M009 Pyogenic arthritis, unspecified: Secondary | ICD-10-CM | POA: Diagnosis not present

## 2017-04-09 DIAGNOSIS — M009 Pyogenic arthritis, unspecified: Secondary | ICD-10-CM | POA: Diagnosis not present

## 2017-04-09 DIAGNOSIS — I10 Essential (primary) hypertension: Secondary | ICD-10-CM | POA: Diagnosis not present

## 2017-04-10 DIAGNOSIS — M009 Pyogenic arthritis, unspecified: Secondary | ICD-10-CM | POA: Diagnosis not present

## 2017-04-10 DIAGNOSIS — I10 Essential (primary) hypertension: Secondary | ICD-10-CM | POA: Diagnosis not present

## 2017-04-11 DIAGNOSIS — M009 Pyogenic arthritis, unspecified: Secondary | ICD-10-CM | POA: Diagnosis not present

## 2017-04-11 DIAGNOSIS — I10 Essential (primary) hypertension: Secondary | ICD-10-CM | POA: Diagnosis not present

## 2018-05-04 IMAGING — CR DG KNEE COMPLETE 4+V*L*
4 series · 4 of 4 positions shown · non-contrast
Comparison: 09/09/2016

CLINICAL DATA: Patellar pain with swelling.  No recent injury.

EXAM:
LEFT KNEE - COMPLETE 4+ VIEW

[t knee ap left]
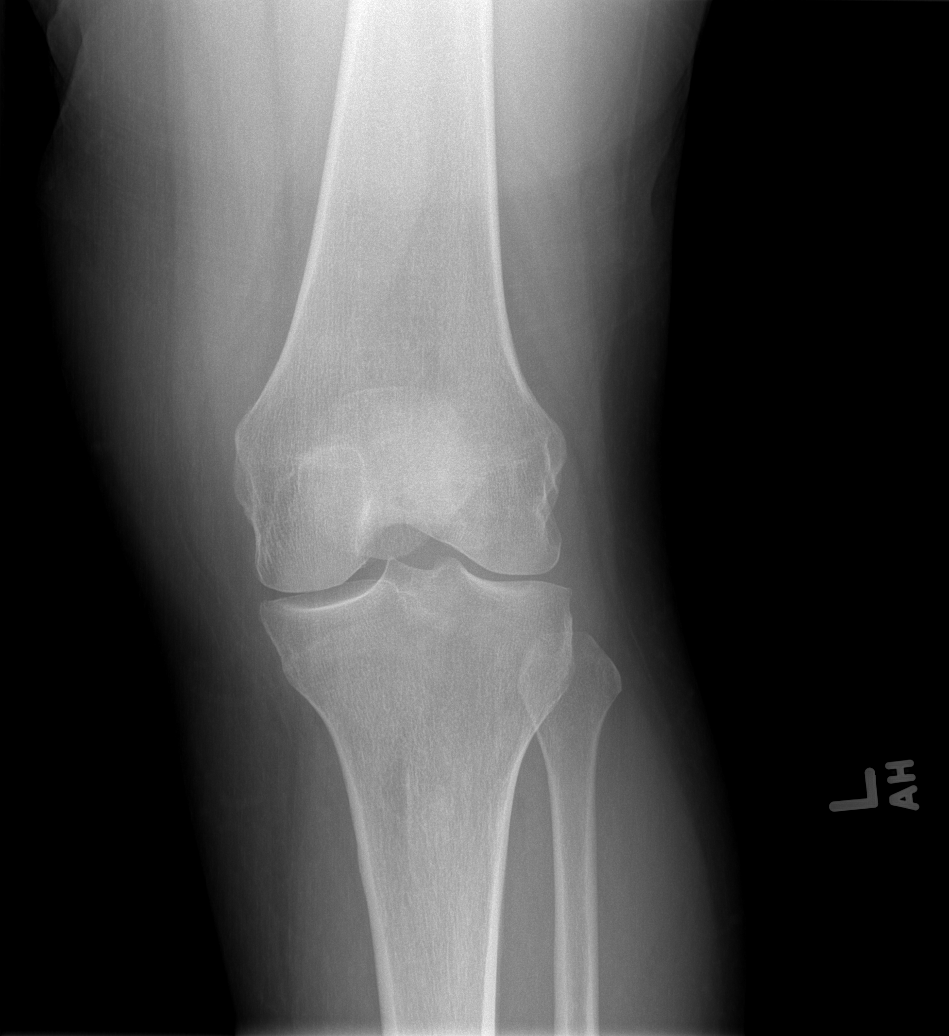

[t knee oblique left (1 of 2)]
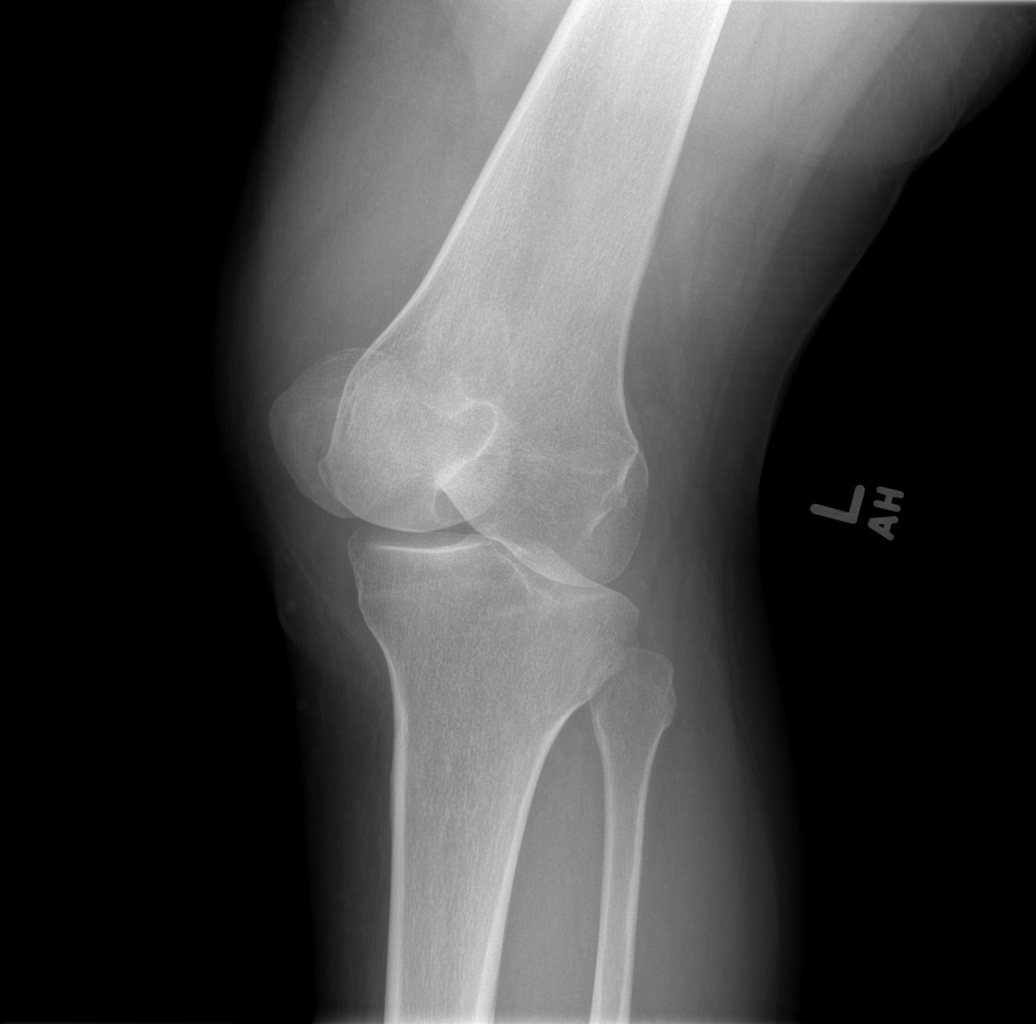

[t knee oblique left (2 of 2)]
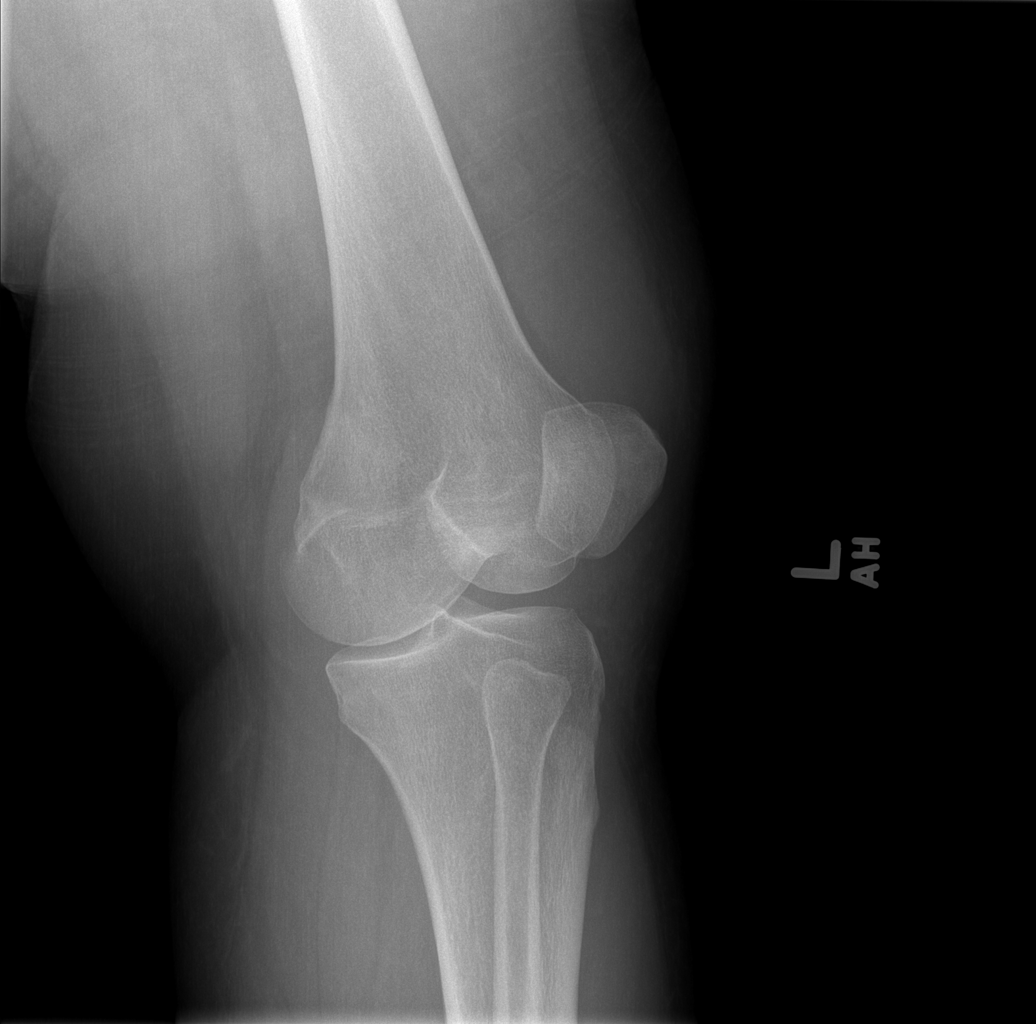

[t knee lat left]
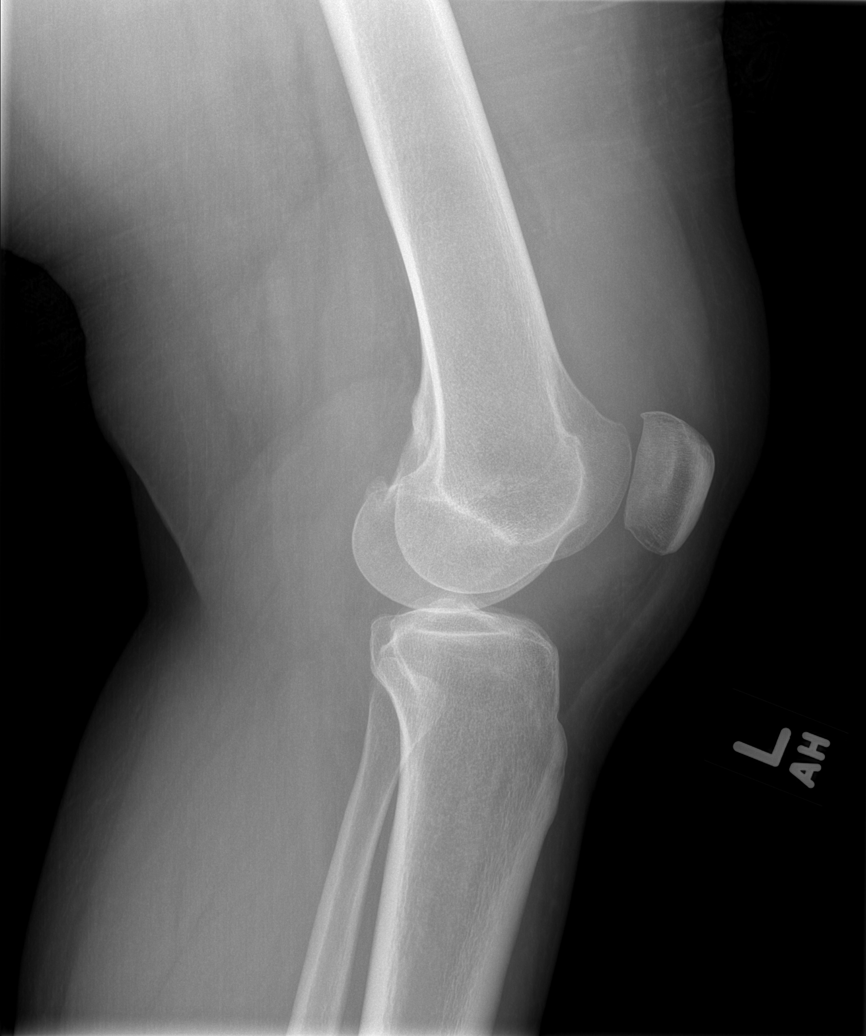

[4 of 4 positions shown; findings below may reference images not displayed]

FINDINGS: No fracture. No subluxation or dislocation. Medial joint space
narrowing noted. Trace hypertrophic spurring visible all 3
compartments. Fluid visible in the suprapatellar bursa.
IMPRESSION: Mild tricompartmental degenerative spurring with joint effusion.

## 2018-06-24 DIAGNOSIS — Z720 Tobacco use: Secondary | ICD-10-CM

## 2018-06-24 DIAGNOSIS — I161 Hypertensive emergency: Secondary | ICD-10-CM

## 2018-06-24 DIAGNOSIS — R9431 Abnormal electrocardiogram [ECG] [EKG]: Secondary | ICD-10-CM

## 2018-06-24 DIAGNOSIS — I10 Essential (primary) hypertension: Secondary | ICD-10-CM

## 2018-06-24 DIAGNOSIS — R0789 Other chest pain: Secondary | ICD-10-CM

## 2023-02-10 DIAGNOSIS — I10 Essential (primary) hypertension: Secondary | ICD-10-CM | POA: Insufficient documentation

## 2023-02-10 DIAGNOSIS — N6323 Unspecified lump in the left breast, lower outer quadrant: Secondary | ICD-10-CM

## 2023-02-10 DIAGNOSIS — N61 Mastitis without abscess: Secondary | ICD-10-CM | POA: Insufficient documentation

## 2023-02-10 HISTORY — DX: Unspecified lump in the left breast, lower outer quadrant: N63.23

## 2023-02-10 HISTORY — DX: Essential (primary) hypertension: I10

## 2023-02-10 HISTORY — DX: Mastitis without abscess: N61.0

## 2023-03-25 ENCOUNTER — Encounter: Payer: Self-pay | Admitting: Cardiology

## 2023-03-25 ENCOUNTER — Encounter: Payer: Self-pay | Admitting: *Deleted

## 2023-04-22 ENCOUNTER — Encounter: Payer: Self-pay | Admitting: Cardiology

## 2023-04-22 ENCOUNTER — Ambulatory Visit: Payer: Managed Care, Other (non HMO) | Attending: Cardiology | Admitting: Cardiology

## 2023-04-22 VITALS — BP 122/90 | HR 96 | Ht 65.0 in | Wt 215.0 lb

## 2023-04-22 DIAGNOSIS — IMO0001 Reserved for inherently not codable concepts without codable children: Secondary | ICD-10-CM

## 2023-04-22 DIAGNOSIS — I10 Essential (primary) hypertension: Secondary | ICD-10-CM

## 2023-04-22 DIAGNOSIS — I701 Atherosclerosis of renal artery: Secondary | ICD-10-CM | POA: Diagnosis not present

## 2023-04-22 DIAGNOSIS — R0609 Other forms of dyspnea: Secondary | ICD-10-CM | POA: Insufficient documentation

## 2023-04-22 DIAGNOSIS — F17200 Nicotine dependence, unspecified, uncomplicated: Secondary | ICD-10-CM

## 2023-04-22 HISTORY — DX: Reserved for inherently not codable concepts without codable children: IMO0001

## 2023-04-22 HISTORY — DX: Other forms of dyspnea: R06.09

## 2023-04-22 HISTORY — DX: Nicotine dependence, unspecified, uncomplicated: F17.200

## 2023-04-22 NOTE — Patient Instructions (Addendum)
Medication Instructions:  Your physician recommends that you continue on your current medications as directed. Please refer to the Current Medication list given to you today.  *If you need a refill on your cardiac medications before your next appointment, please call your pharmacy*   Lab Work: None Ordered If you have labs (blood work) drawn today and your tests are completely normal, you will receive your results only by: MyChart Message (if you have MyChart) OR A paper copy in the mail If you have any lab test that is abnormal or we need to change your treatment, we will call you to review the results.   Testing/Procedures: Your physician has requested that you have an echocardiogram. Echocardiography is a painless test that uses sound waves to create images of your heart. It provides your doctor with information about the size and shape of your heart and how well your heart's chambers and valves are working. This procedure takes approximately one hour. There are no restrictions for this procedure. Please do NOT wear cologne, perfume, aftershave, or lotions (deodorant is allowed). Please arrive 15 minutes prior to your appointment time.  Your physician has requested that you have a renal artery duplex. During this test, an ultrasound is used to evaluate blood flow to the kidneys. Allow one hour for this exam. Do not eat after midnight the day before and avoid carbonated beverages. Take your medications as you usually do.     Follow-Up: At [facility], you and your health needs are our priority.  As part of our continuing mission to provide you with exceptional heart care, we have created designated Provider Care Teams.  These Care Teams include your primary Cardiologist (physician) and Advanced Practice Providers (APPs -  Physician Assistants and Nurse Practitioners) who all work together to provide you with the care you need, when you need it.  We recommend signing up for the patient  portal called "MyChart".  Sign up information is provided on this After Visit Summary.  MyChart is used to connect with patients for Virtual Visits (Telemedicine).  Patients are able to view lab/test results, encounter notes, upcoming appointments, etc.  Non-urgent messages can be sent to your provider as well.   To learn more about what you can do with MyChart, go to [email or web address].    Your next appointment:   1 month(s)  The format for your next appointment:   In Person  Provider:   Gypsy Balsam, MD    Other Instructions NA

## 2023-04-22 NOTE — Addendum Note (Signed)
Addended by: Baldo Ash D on: 04/22/2023 04:28 PM   Modules accepted: Orders

## 2023-04-22 NOTE — Progress Notes (Signed)
Cardiology Consultation:    Date:  04/22/2023   ID:  Kelly Moran, DOB 01-28-1981, MRN [id number]  PCP:  Kelly Moran  Cardiologist:  Kelly Balsam, MD   Referring MD: Kelly Floor., MD   Chief Complaint  Patient presents with   Hypertension    History of Present Illness:    Kelly Moran is a 42 y.o. female who is being seen today for the evaluation of essential hypertension at the request of Kelly Floor., MD. past Moran history significant for hypertension that been suffering from many years.  In 2019 she was in round of hospital day, echocardiogram was done which showed very significant left ventricular hypertrophy with wall thickness of 20 mm, additional problem include dyspnea on exertion, likely her cholesterol is good, she is a smoker.  She does have multiple family members with chronic kidney failure 2 of her sisters and up having chronic kidney failure that probably because of diabetes.  Likely she does not have diabetes but last hemoglobin A1c is from 2018 which is a very elevated 5.8.  She works in a Orthoptist yard, she is Horticulturist, commercial.  She still smokes, she drinks socially.  She is in the office with her husband and her husband tells me that she snores some but that she does not stop breathing at night  Past Moran History:  Diagnosis Date   Breast infection 02/10/2023   Chronic pain of left knee 01/07/2017   Essential hypertension 02/10/2023   Hypertension    Onychomycosis due to dermatophyte 03/04/2017   Tinea pedis of both feet 03/04/2017   Unspecified lump in the left breast, lower outer quadrant 02/10/2023    Past Surgical History:  Procedure Laterality Date   ABDOMINAL HYSTERECTOMY  2012   knee sugery Left    TONSILLECTOMY  2004   TUBAL LIGATION      Current Medications: Current Meds  Medication Sig   amLODipine (NORVASC) 2.5 MG tablet Take 2.5 mg by mouth daily.   cloNIDine (CATAPRES) 0.1 MG tablet Take 2  tablets by mouth 3 (three) times daily as needed (hypertension).   hydrALAZINE (APRESOLINE) 10 MG tablet Take 2 tablets by mouth 4 (four) times daily.   hydrochlorothiazide (HYDRODIURIL) 25 MG tablet Take 25 mg by mouth daily.     Allergies:   Atenolol, Aspirin, and Lisinopril   Social History   Socioeconomic History   Marital status: Single    Spouse name: Not on file   Number of children: Not on file   Years of education: Not on file   Highest education level: Not on file  Occupational History   Not on file  Tobacco Use   Smoking status: Every Day    Current packs/day: 0.50    Types: Cigarettes   Smokeless tobacco: Never  Substance and Sexual Activity   Alcohol use: Yes    Comment: Occasional   Drug use: Yes    Types: Marijuana   Sexual activity: Yes    Birth control/protection: Surgical  Other Topics Concern   Not on file  Social History Narrative   Not on file   Social Determinants of Health   Financial Resource Strain: Not on file  Food Insecurity: Not on file  Transportation Needs: Not on file  Physical Activity: Not on file  Stress: Not on file  Social Connections: Not on file     Family History: The patient's family history includes Arthritis in her mother; Asthma in her mother; Diabetes in  her father, mother, and sister; Hypertension in her father, mother, and sister. ROS:   Please see the history of present illness.    All 14 point review of systems negative except as described per history of present illness.  EKGs/Labs/Other Studies Reviewed:    The following studies were reviewed today:   EKG:  EKG Interpretation Date/Time:  Wednesday April 22 2023 15:50:30 EDT Ventricular Rate:  96 PR Interval:  148 QRS Duration:  78 QT Interval:  370 QTC Calculation: 467 R Axis:   -9  Text Interpretation: Normal sinus rhythm Possible Left atrial enlargement Left ventricular hypertrophy with repolarization abnormality Abnormal ECG No previous ECGs available  Confirmed by Kelly Moran [phone]) on 04/22/2023 3:57:01 PM    Recent Labs: No results found for requested labs within last 365 days.  Recent Lipid Panel No results found for: "CHOL", "TRIG", "HDL", "CHOLHDL", "VLDL", "LDLCALC", "LDLDIRECT"  Physical Exam:    VS:  BP (!) 122/90 (BP Location: Left Arm, Patient Position: Sitting)   Pulse 96   Ht 5\' 5"  (1.651 m)   Wt 215 lb (97.5 kg)   SpO2 98%   BMI 35.78 kg/m     Wt Readings from Last 3 Encounters:  04/22/23 215 lb (97.5 kg)  03/18/23 217 lb (98.4 kg)  10/28/16 200 lb (90.7 kg)     GEN:  Well nourished, well developed in no acute distress HEENT: Normal NECK: No JVD; No carotid bruits LYMPHATICS: No lymphadenopathy CARDIAC: RRR, systolic ejection murmur grade 1/6 to 2/6 best heard right upper portion of the sternum, no rubs, no gallops RESPIRATORY:  Clear to auscultation without rales, wheezing or rhonchi  ABDOMEN: Soft, non-tender, non-distended MUSCULOSKELETAL:  No edema; No deformity  SKIN: Warm and dry NEUROLOGIC:  Alert and oriented x 3 PSYCHIATRIC:  Normal affect   ASSESSMENT:    1. Essential hypertension   2. Dyspnea on exertion   3. Smoking    PLAN:    In order of problems listed above:  Essential hypertension seems to be difficult to control.  I think honestly we need to simplify this management.  She played too much with clonidine which may be responsible for high fluctuation of her blood pressure so what I told her today is to start taking 2 clonidine every single day and watch her blood pressure if it still elevated meaning more than 160 or 100 think increase dose to 3 times a day I told her if she will have spikes of blood pressure she had to take hydralazine 10 mg.  On top of that she takes amlodipine.  Previously she tried ACE inhibitor beta-blocker she was unable to tolerate it. When I see her probably will put 24-hour blood pressure monitor to see distribution over 24 hours.  In terms of etiology of  this phenomenon probably genetic multiple family members have been elevated blood pressure.  However, I will do renal ultrasound to look for any significant renal artery stenosis.  A part of evaluation will do also echocardiogram to assess degree of left ventricle hypertrophy.  She does have ejection systolic murmur more she may be developing sigmoid septum already with some obstruction of the outflow tract. Cholesterol status nicely controlled with LDL of 79 HDL 61. Smoking obviously she needs to quit she understands she will try to work on it   Medication Adjustments/Labs and Tests Ordered: Current medicines are reviewed at length with the patient today.  Concerns regarding medicines are outlined above.  Orders Placed This Encounter  Procedures  EKG 12-Lead   No orders of the defined types were placed in this encounter.   Signed, Georgeanna Lea, MD, [facility]. 04/22/2023 4:17 PM    [facility]

## 2023-05-21 ENCOUNTER — Ambulatory Visit: Payer: Managed Care, Other (non HMO) | Attending: Cardiology

## 2023-05-21 ENCOUNTER — Ambulatory Visit: Payer: Managed Care, Other (non HMO)

## 2023-05-21 ENCOUNTER — Ambulatory Visit: Payer: Managed Care, Other (non HMO) | Attending: Cardiology | Admitting: Cardiology

## 2023-05-21 ENCOUNTER — Encounter: Payer: Self-pay | Admitting: Cardiology

## 2023-05-21 VITALS — BP 140/92 | HR 77 | Ht 65.0 in | Wt 219.4 lb

## 2023-05-21 DIAGNOSIS — I340 Nonrheumatic mitral (valve) insufficiency: Secondary | ICD-10-CM | POA: Diagnosis not present

## 2023-05-21 DIAGNOSIS — I5030 Unspecified diastolic (congestive) heart failure: Secondary | ICD-10-CM | POA: Diagnosis not present

## 2023-05-21 DIAGNOSIS — I517 Cardiomegaly: Secondary | ICD-10-CM

## 2023-05-21 DIAGNOSIS — I701 Atherosclerosis of renal artery: Secondary | ICD-10-CM

## 2023-05-21 DIAGNOSIS — R0609 Other forms of dyspnea: Secondary | ICD-10-CM | POA: Diagnosis not present

## 2023-05-21 DIAGNOSIS — F17200 Nicotine dependence, unspecified, uncomplicated: Secondary | ICD-10-CM | POA: Diagnosis not present

## 2023-05-21 DIAGNOSIS — I10 Essential (primary) hypertension: Secondary | ICD-10-CM

## 2023-05-21 HISTORY — DX: Cardiomegaly: I51.7

## 2023-05-21 LAB — ECHOCARDIOGRAM COMPLETE
Est EF: >75
S' Lateral: 2.3 cm

## 2023-05-21 MED ORDER — METOPROLOL TARTRATE 25 MG PO TABS: 25.0000 mg | ORAL_TABLET | Freq: Two times a day (BID) | ORAL | 3 refills | Status: DC

## 2023-05-21 NOTE — Patient Instructions (Signed)
Medication Instructions:   START: Metoprolol Tartrate 25mg  1/2 tablet twice daily for 1 week then 25mg  twice daily   Lab Work: None Ordered If you have labs (blood work) drawn today and your tests are completely normal, you will receive your results only by: [employer] (if you have MyChart) OR A paper copy in the mail If you have any lab test that is abnormal or we need to change your treatment, we will call you to review the results.   Testing/Procedures:  WHY IS MY DOCTOR PRESCRIBING ZIO? The Zio system is proven and trusted by physicians to detect and diagnose irregular heart rhythms -- and has been prescribed to hundreds of thousands of patients.  The FDA has cleared the Zio system to monitor for many different kinds of irregular heart rhythms. In a study, physicians were able to reach a diagnosis 90% of the time with the Zio system1.  You can wear the Zio monitor -- a small, discreet, comfortable patch -- during your normal day-to-day activity, including while you sleep, shower, and exercise, while it records every single heartbeat for analysis.  1Barrett, P., et al. Comparison of 24 Hour Holter Monitoring Versus 14 Day Novel Adhesive Patch Electrocardiographic Monitoring. American Journal of Medicine, 2014.  ZIO VS. HOLTER MONITORING The Zio monitor can be comfortably worn for up to 14 days. Holter monitors can be worn for 24 to 48 hours, limiting the time to record any irregular heart rhythms you may have. Zio is able to capture data for the 51% of patients who have their first symptom-triggered arrhythmia after 48 hours.1  LIVE WITHOUT RESTRICTIONS The Zio ambulatory cardiac monitor is a small, unobtrusive, and water-resistant patch--you might even forget you're wearing it. The Zio monitor records and stores every beat of your heart, whether you're sleeping, working out, or showering.     Follow-Up: At [facility], you and your health needs are our priority.  As part  of our continuing mission to provide you with exceptional heart care, we have created designated Provider Care Teams.  These Care Teams include your primary Cardiologist (physician) and Advanced Practice Providers (APPs -  Physician Assistants and Nurse Practitioners) who all work together to provide you with the care you need, when you need it.  We recommend signing up for the patient portal called "MyChart".  Sign up information is provided on this After Visit Summary.  MyChart is used to connect with patients for Virtual Visits (Telemedicine).  Patients are able to view lab/test results, encounter notes, upcoming appointments, etc.  Non-urgent messages can be sent to your provider as well.   To learn more about what you can do with MyChart, go to [email or web address].    Your next appointment:   10 day(s)  The format for your next appointment:   In Person  Provider:   Gypsy Balsam, MD    Other Instructions NA

## 2023-05-21 NOTE — Progress Notes (Signed)
Cardiology Office Note:    Date:  05/21/2023   ID:  Kelly Moran, DOB December 11, 1980, MRN [id number]  PCP:  [facility]  Cardiologist:  Gypsy Balsam, MD    Referring MD: [facility]*   Chief Complaint  Patient presents with   Results    History of Present Illness:    Kelly Moran is a 42 y.o. female with past medical history significant for essential hypertension that she has been struggling for years she did have echocardiogram done in 2019 in [facility] which showed wall thickness of 20 mmHg.  Additional problem include dyspnea on exertion and also some dizziness she is a smoker as well as have high cholesterol.  She did have echocardiogram today shows significant increased thickening of the wall of her heart with interventricular septum measuring 26 mm, there was no significant outflow tract obstruction but there was some obliteration of the cavity mid level during the systole.  She comes today to talk about it.  She says she is feeling better her blood pressure seems to be better controlled still weak tired exhausted.  She works in Film/video editor yard and she gets tired quite easily with some episode of dizziness.  No palpitations  Past Medical History:  Diagnosis Date   Breast infection 02/10/2023   Chronic pain of left knee 01/07/2017   Essential hypertension 02/10/2023   Hypertension    Onychomycosis due to dermatophyte 03/04/2017   Tinea pedis of both feet 03/04/2017   Unspecified lump in the left breast, lower outer quadrant 02/10/2023    Past Surgical History:  Procedure Laterality Date   ABDOMINAL HYSTERECTOMY  2012   knee sugery Left    TONSILLECTOMY  2004   TUBAL LIGATION      Current Medications: Current Meds  Medication Sig   amLODipine (NORVASC) 2.5 MG tablet Take 2.5 mg by mouth daily.   cloNIDine (CATAPRES) 0.1 MG tablet Take 2 tablets by mouth 3 (three) times daily as needed (hypertension).   hydrALAZINE (APRESOLINE)  10 MG tablet Take 2 tablets by mouth 4 (four) times daily.   hydrochlorothiazide (HYDRODIURIL) 25 MG tablet Take 25 mg by mouth daily.     Allergies:   Atenolol, Aspirin, and Lisinopril   Social History   Socioeconomic History   Marital status: Single    Spouse name: Not on file   Number of children: Not on file   Years of education: Not on file   Highest education level: Not on file  Occupational History   Not on file  Tobacco Use   Smoking status: Every Day    Current packs/day: 0.50    Types: Cigarettes   Smokeless tobacco: Never  Substance and Sexual Activity   Alcohol use: Yes    Comment: Occasional   Drug use: Yes    Types: Marijuana   Sexual activity: Yes    Birth control/protection: Surgical  Other Topics Concern   Not on file  Social History Narrative   Not on file   Social Determinants of Health   Financial Resource Strain: Not on file  Food Insecurity: Not on file  Transportation Needs: Not on file  Physical Activity: Not on file  Stress: Not on file  Social Connections: Not on file     Family History: The patient's family history includes Arthritis in her mother; Asthma in her mother; Diabetes in her father, mother, and sister; Hypertension in her father, mother, and sister. ROS:   Please see the history of  present illness.    All 14 point review of systems negative except as described per history of present illness  EKGs/Labs/Other Studies Reviewed:         Recent Labs: No results found for requested labs within last 365 days.  Recent Lipid Panel No results found for: "CHOL", "TRIG", "HDL", "CHOLHDL", "VLDL", "LDLCALC", "LDLDIRECT"  Physical Exam:    VS:  BP (!) 140/92 (BP Location: Left Arm, Patient Position: Sitting)   Pulse 77   Ht 5\' 5"  (1.651 m)   Wt 219 lb 6.4 oz (99.5 kg)   SpO2 98%   BMI 36.51 kg/m     Wt Readings from Last 3 Encounters:  05/21/23 219 lb 6.4 oz (99.5 kg)  04/22/23 215 lb (97.5 kg)  03/18/23 217 lb (98.4 kg)      GEN:  Well nourished, well developed in no acute distress HEENT: Normal NECK: No JVD; No carotid bruits LYMPHATICS: No lymphadenopathy CARDIAC: RRR, no murmurs, no rubs, no gallops RESPIRATORY:  Clear to auscultation without rales, wheezing or rhonchi  ABDOMEN: Soft, non-tender, non-distended MUSCULOSKELETAL:  No edema; No deformity  SKIN: Warm and dry LOWER EXTREMITIES: no swelling NEUROLOGIC:  Alert and oriented x 3 PSYCHIATRIC:  Normal affect   ASSESSMENT:    1. Essential hypertension   2. Dyspnea on exertion   3. Smoking   4. LVH (left ventricular hypertrophy)    PLAN:    In order of problems listed above:  Essential hypertension seems to be better controlled but looking at the echocardiogram with severe left ventricular hypertrophy with mid ventricle obstruction almost complete obliteration concerns about potential arrhythmia, I asked her to wear Zio patch for 2 weeks.  Will also try to give her a small dose of beta-blocker we will start with 12.5 on Toprol tartrate twice daily gradually increase the dose.  In the future with her most likely put 24 his blood pressure monitor on her.  She did have renal ultrasound done today which showed no significant renal artery stenosis.  She also will be referred to our hypertrophic clinic.  I see her back in months very quickly within next 10 days to 2 weeks. Dyspnea exertion most likely related to severe left ventricular hypertrophy, even though the transmitral flow pattern was characteristic for extension abnormalities. Smoking she understands the problem trying to work on quitting this.   Medication Adjustments/Labs and Tests Ordered: Current medicines are reviewed at length with the patient today.  Concerns regarding medicines are outlined above.  No orders of the defined types were placed in this encounter.  Medication changes: No orders of the defined types were placed in this encounter.   Signed, Georgeanna Lea, MD,  [facility] 05/21/2023 4:24 PM    [facility]

## 2023-05-29 DIAGNOSIS — I10 Essential (primary) hypertension: Secondary | ICD-10-CM | POA: Insufficient documentation

## 2023-06-01 ENCOUNTER — Ambulatory Visit: Payer: Managed Care, Other (non HMO) | Attending: Cardiology | Admitting: Cardiology

## 2023-06-01 ENCOUNTER — Encounter: Payer: Self-pay | Admitting: Cardiology

## 2023-06-01 VITALS — BP 155/113 | HR 80 | Ht 65.0 in | Wt 217.8 lb

## 2023-06-01 DIAGNOSIS — F17200 Nicotine dependence, unspecified, uncomplicated: Secondary | ICD-10-CM

## 2023-06-01 DIAGNOSIS — R0609 Other forms of dyspnea: Secondary | ICD-10-CM

## 2023-06-01 DIAGNOSIS — I517 Cardiomegaly: Secondary | ICD-10-CM

## 2023-06-01 DIAGNOSIS — I10 Essential (primary) hypertension: Secondary | ICD-10-CM | POA: Diagnosis not present

## 2023-06-01 MED ORDER — AMLODIPINE BESYLATE 5 MG PO TABS: 5.0000 mg | ORAL_TABLET | Freq: Every day | ORAL | 3 refills | Status: DC

## 2023-06-01 NOTE — Patient Instructions (Signed)
Medication Instructions:  Your physician has recommended you make the following change in your medication:   START: Amlodipine 5 mg daily  *If you need a refill on your cardiac medications before your next appointment, please call your pharmacy*   Lab Work: Your physician recommends that you return for lab work in:   Labs today: H&H  If you have labs (blood work) drawn today and your tests are completely normal, you will receive your results only by: MyChart Message (if you have MyChart) OR A paper copy in the mail If you have any lab test that is abnormal or we need to change your treatment, we will call you to review the results.   Testing/Procedures: Cardiac MRI   Follow-Up: At [facility], you and your health needs are our priority.  As part of our continuing mission to provide you with exceptional heart care, we have created designated Provider Care Teams.  These Care Teams include your primary Cardiologist (physician) and Advanced Practice Providers (APPs -  Physician Assistants and Nurse Practitioners) who all work together to provide you with the care you need, when you need it.  We recommend signing up for the patient portal called "MyChart".  Sign up information is provided on this After Visit Summary.  MyChart is used to connect with patients for Virtual Visits (Telemedicine).  Patients are able to view lab/test results, encounter notes, upcoming appointments, etc.  Non-urgent messages can be sent to your provider as well.   To learn more about what you can do with MyChart, go to [email or web address].    Your next appointment:   2 week(s)  Provider:   Gypsy Balsam, MD    Other Instructions None

## 2023-06-01 NOTE — Progress Notes (Signed)
Cardiology Office Note:    Date:  06/01/2023   ID:  LORAH KESSELMAN, DOB [birth year], MRN [id number]  PCP:  [facility]  Cardiologist:  Gypsy Balsam, MD    Referring MD: [facility]   No chief complaint on file.   History of Present Illness:    Kelly Moran is a 42 y.o. female with past medical history significant for essential hypertension that being poorly controlled for a long time.  She does have evidence of severe left ventricle hypertrophy with thickness of 26 mmHg with significant mid cavitary obliteration.  Comes today to months for follow-up blood pressure better at home she also tells me that some her blood pressure is 120/70 however elevated today in the office.  She also complained of having some fatigue tiredness.  She tells me sometimes she does not take all clonidine's.  I told her not to play with clonidine.  The goal will be also to reduce clonidine because it made her feel sleepy.  Today ask her to increase dose of amlodipine to 5 mg daily, we will schedule her to have MRI to make sure that the severe left ventricle hypertrophy is only related to LVH and not hypertrophic cardiomyopathy.  In the future and planning also there is a 24-hour blood pressure monitor but before I want to get her on stable medical therapy.  Past Medical History:  Diagnosis Date   Breast infection 02/10/2023   Chronic pain of left knee 01/07/2017   Dyspnea on exertion 04/22/2023   Essential hypertension 02/10/2023   Hypertension    LVH (left ventricular hypertrophy) 05/21/2023   Onychomycosis due to dermatophyte 03/04/2017   Smoking 04/22/2023   Tinea pedis of both feet 03/04/2017   Unspecified lump in the left breast, lower outer quadrant 02/10/2023    Past Surgical History:  Procedure Laterality Date   ABDOMINAL HYSTERECTOMY  2012   knee sugery Left    TONSILLECTOMY  2004   TUBAL LIGATION      Current Medications: Current Meds  Medication Sig    amLODipine (NORVASC) 5 MG tablet Take 1 tablet (5 mg total) by mouth daily.   cloNIDine (CATAPRES) 0.1 MG tablet Take 2 tablets by mouth 3 (three) times daily as needed (hypertension).   hydrALAZINE (APRESOLINE) 10 MG tablet Take 2 tablets by mouth 4 (four) times daily.   hydrochlorothiazide (HYDRODIURIL) 25 MG tablet Take 25 mg by mouth daily.   metoprolol tartrate (LOPRESSOR) 25 MG tablet Take 1 tablet (25 mg total) by mouth 2 (two) times daily. Take 1/2 tablet twice daily for 1 week then 1 tablet twice daily therafter   [DISCONTINUED] amLODipine (NORVASC) 2.5 MG tablet Take 2.5 mg by mouth daily.     Allergies:   Atenolol, Aspirin, and Lisinopril   Social History   Socioeconomic History   Marital status: Single    Spouse name: Not on file   Number of children: Not on file   Years of education: Not on file   Highest education level: Not on file  Occupational History   Not on file  Tobacco Use   Smoking status: Every Day    Current packs/day: 0.50    Types: Cigarettes   Smokeless tobacco: Never  Substance and Sexual Activity   Alcohol use: Yes    Comment: Occasional   Drug use: Yes    Types: Marijuana   Sexual activity: Yes    Birth control/protection: Surgical  Other Topics Concern   Not on file  Social History Narrative   Not on file   Social Determinants of Health   Financial Resource Strain: Not on file  Food Insecurity: Not on file  Transportation Needs: Not on file  Physical Activity: Not on file  Stress: Not on file  Social Connections: Not on file     Family History: The patient's family history includes Arthritis in her mother; Asthma in her mother; Diabetes in her father, mother, and sister; Hypertension in her father, mother, and sister. ROS:   Please see the history of present illness.    All 14 point review of systems negative except as described per history of present illness  EKGs/Labs/Other Studies Reviewed:         Recent Labs: No results  found for requested labs within last 365 days.  Recent Lipid Panel No results found for: "CHOL", "TRIG", "HDL", "CHOLHDL", "VLDL", "LDLCALC", "LDLDIRECT"  Physical Exam:    VS:  BP (!) 155/113   Pulse 80   Ht 5\' 5"  (1.651 m)   Wt 217 lb 12.8 oz (98.8 kg)   SpO2 97%   BMI 36.24 kg/m     Wt Readings from Last 3 Encounters:  06/01/23 217 lb 12.8 oz (98.8 kg)  05/21/23 219 lb 6.4 oz (99.5 kg)  04/22/23 215 lb (97.5 kg)     GEN:  Well nourished, well developed in no acute distress HEENT: Normal NECK: No JVD; No carotid bruits LYMPHATICS: No lymphadenopathy CARDIAC: RRR, no murmurs, no rubs, no gallops RESPIRATORY:  Clear to auscultation without rales, wheezing or rhonchi  ABDOMEN: Soft, non-tender, non-distended MUSCULOSKELETAL:  No edema; No deformity  SKIN: Warm and dry LOWER EXTREMITIES: no swelling NEUROLOGIC:  Alert and oriented x 3 PSYCHIATRIC:  Normal affect   ASSESSMENT:    1. LVH (left ventricular hypertrophy)   2. Essential hypertension   3. Smoking   4. Dyspnea on exertion    PLAN:    In order of problems listed above:  Essential hypertension with left ventricular hypertrophy quite severe.  Plan as described above.  Will increase dose of amlodipine, she will bring me blood pressure monitor results Smoking: She understands the problem and try to cut down number of cigarettes that she smokes. Left ventricular hypertrophy severe measuring 26 mm.  Will do MRI make sure we not dealing with hypertrophic cardiomyopathy.   Medication Adjustments/Labs and Tests Ordered: Current medicines are reviewed at length with the patient today.  Concerns regarding medicines are outlined above.  Orders Placed This Encounter  Procedures   MR CARDIAC MORPHOLOGY W WO CONTRAST   Hemoglobin and hematocrit, blood   Hemoglobin and hematocrit, blood   Medication changes:  Meds ordered this encounter  Medications   amLODipine (NORVASC) 5 MG tablet    Sig: Take 1 tablet (5 mg  total) by mouth daily.    Dispense:  90 tablet    Refill:  3    Signed, Georgeanna Lea, MD, [facility] 06/01/2023 12:02 PM    [facility]

## 2023-06-02 LAB — HEMOGLOBIN AND HEMATOCRIT, BLOOD
Hematocrit: 43.1 % (ref 34.0–46.6)
Hemoglobin: 14.5 g/dL (ref 11.1–15.9)

## 2023-06-05 ENCOUNTER — Ambulatory Visit: Payer: Managed Care, Other (non HMO) | Admitting: Cardiology

## 2023-06-17 ENCOUNTER — Ambulatory Visit: Payer: Managed Care, Other (non HMO)

## 2023-06-17 ENCOUNTER — Ambulatory Visit: Payer: Managed Care, Other (non HMO) | Attending: Cardiology | Admitting: Cardiology

## 2023-06-17 ENCOUNTER — Telehealth: Payer: Self-pay

## 2023-06-17 ENCOUNTER — Encounter: Payer: Self-pay | Admitting: Cardiology

## 2023-06-17 VITALS — BP 100/72 | HR 73 | Ht 65.0 in | Wt 220.4 lb

## 2023-06-17 DIAGNOSIS — F17200 Nicotine dependence, unspecified, uncomplicated: Secondary | ICD-10-CM | POA: Diagnosis not present

## 2023-06-17 DIAGNOSIS — R0609 Other forms of dyspnea: Secondary | ICD-10-CM

## 2023-06-17 DIAGNOSIS — I517 Cardiomegaly: Secondary | ICD-10-CM

## 2023-06-17 DIAGNOSIS — I10 Essential (primary) hypertension: Secondary | ICD-10-CM

## 2023-06-17 NOTE — Patient Instructions (Addendum)
Medication Instructions:  Your physician recommends that you continue on your current medications as directed. Please refer to the Current Medication list given to you today.  *If you need a refill on your cardiac medications before your next appointment, please call your pharmacy*   Lab Work:  If you have labs (blood work) drawn today and your tests are completely normal, you will receive your results only by: MyChart Message (if you have MyChart) OR A paper copy in the mail If you have any lab test that is abnormal or we need to change your treatment, we will call you to review the results.   Testing/Procedures: 24 Hour Blood pressure Monitor   Follow-Up: At [facility], you and your health needs are our priority.  As part of our continuing mission to provide you with exceptional heart care, we have created designated Provider Care Teams.  These Care Teams include your primary Cardiologist (physician) and Advanced Practice Providers (APPs -  Physician Assistants and Nurse Practitioners) who all work together to provide you with the care you need, when you need it.  We recommend signing up for the patient portal called "MyChart".  Sign up information is provided on this After Visit Summary.  MyChart is used to connect with patients for Virtual Visits (Telemedicine).  Patients are able to view lab/test results, encounter notes, upcoming appointments, etc.  Non-urgent messages can be sent to your provider as well.   To learn more about what you can do with MyChart, go to [email or web address].    Your next appointment:   1 month   The format for your next appointment:   In Person  Provider:   Gypsy Balsam, MD    Other Instructions

## 2023-06-17 NOTE — Progress Notes (Unsigned)
Cardiology Office Note:    Date:  06/17/2023   ID:  HARLOW FREEMONT, DOB 07/27/1981, MRN [id number]  PCP:  [facility]  Cardiologist:  Gypsy Balsam, MD    Referring MD: [facility]   Chief Complaint  Patient presents with   Work status    Discuss BP monitor     History of Present Illness:    Kelly Moran is a 42 y.o. female with past medical history significant for essential hypertension which is difficult to control, severe left ventricle hypertrophy with wall thickness of 26 mm with significant mid cavitary obliteration, comes today to my office for follow-up she checked her blood pressure on the regular basis and she does have a notebook and usually blood pressure is actually very well-controlled interesting today actually is low denies have any dizziness passing out no palpitations.  Monitor review no sustained arrhythmias  Past Medical History:  Diagnosis Date   Breast infection 02/10/2023   Chronic pain of left knee 01/07/2017   Dyspnea on exertion 04/22/2023   Essential hypertension 02/10/2023   Hypertension    LVH (left ventricular hypertrophy) 05/21/2023   Onychomycosis due to dermatophyte 03/04/2017   Smoking 04/22/2023   Tinea pedis of both feet 03/04/2017   Unspecified lump in the left breast, lower outer quadrant 02/10/2023    Past Surgical History:  Procedure Laterality Date   ABDOMINAL HYSTERECTOMY  2012   knee sugery Left    TONSILLECTOMY  2004   TUBAL LIGATION      Current Medications: Current Meds  Medication Sig   amLODipine (NORVASC) 5 MG tablet Take 1 tablet (5 mg total) by mouth daily.   cloNIDine (CATAPRES) 0.1 MG tablet Take 2 tablets by mouth 3 (three) times daily as needed (hypertension).   hydrALAZINE (APRESOLINE) 10 MG tablet Take 2 tablets by mouth 4 (four) times daily.   hydrochlorothiazide (HYDRODIURIL) 25 MG tablet Take 25 mg by mouth daily.   metoprolol tartrate (LOPRESSOR) 25 MG tablet Take 1  tablet (25 mg total) by mouth 2 (two) times daily. Take 1/2 tablet twice daily for 1 week then 1 tablet twice daily therafter     Allergies:   Atenolol, Aspirin, and Lisinopril   Social History   Socioeconomic History   Marital status: Single    Spouse name: Not on file   Number of children: Not on file   Years of education: Not on file   Highest education level: Not on file  Occupational History   Not on file  Tobacco Use   Smoking status: Every Day    Current packs/day: 0.50    Types: Cigarettes   Smokeless tobacco: Never  Substance and Sexual Activity   Alcohol use: Yes    Comment: Occasional   Drug use: Yes    Types: Marijuana   Sexual activity: Yes    Birth control/protection: Surgical  Other Topics Concern   Not on file  Social History Narrative   Not on file   Social Determinants of Health   Financial Resource Strain: Not on file  Food Insecurity: Not on file  Transportation Needs: Not on file  Physical Activity: Not on file  Stress: Not on file  Social Connections: Not on file     Family History: The patient's family history includes Arthritis in her mother; Asthma in her mother; Diabetes in her father, mother, and sister; Hypertension in her father, mother, and sister. ROS:   Please see the history of present illness.  All 14 point review of systems negative except as described per history of present illness  EKGs/Labs/Other Studies Reviewed:         Recent Labs: 06/01/2023: Hemoglobin 14.5  Recent Lipid Panel No results found for: "CHOL", "TRIG", "HDL", "CHOLHDL", "VLDL", "LDLCALC", "LDLDIRECT"  Physical Exam:    VS:  BP 100/72 (BP Location: Left Arm, Patient Position: Sitting)   Pulse 73   Ht 5\' 5"  (1.651 m)   Wt 220 lb 6.4 oz (100 kg)   SpO2 95%   BMI 36.68 kg/m     Wt Readings from Last 3 Encounters:  06/17/23 220 lb 6.4 oz (100 kg)  06/01/23 217 lb 12.8 oz (98.8 kg)  05/21/23 219 lb 6.4 oz (99.5 kg)     GEN:  Well nourished, well  developed in no acute distress HEENT: Normal NECK: No JVD; No carotid bruits LYMPHATICS: No lymphadenopathy CARDIAC: RRR, no murmurs, no rubs, no gallops RESPIRATORY:  Clear to auscultation without rales, wheezing or rhonchi  ABDOMEN: Soft, non-tender, non-distended MUSCULOSKELETAL:  No edema; No deformity  SKIN: Warm and dry LOWER EXTREMITIES: no swelling NEUROLOGIC:  Alert and oriented x 3 PSYCHIATRIC:  Normal affect   ASSESSMENT:    1. Essential hypertension   2. Dyspnea on exertion   3. LVH (left ventricular hypertrophy)   4. Smoking    PLAN:    In order of problems listed above:  Essential hypertension now may be even a little overcontrolled.  She is very smart about checking her blood pressure and adjusting her medication.  Will put 24 hours blood pressure monitor.  She is waiting for MRI to assess for dealing with some degree of hypertrophic cardiomyopathy of this is just related to to essential hypertension. Dyspnea on exertion better now and blood pressures improved. LVH plan as described above   Medication Adjustments/Labs and Tests Ordered: Current medicines are reviewed at length with the patient today.  Concerns regarding medicines are outlined above.  No orders of the defined types were placed in this encounter.  Medication changes: No orders of the defined types were placed in this encounter.   Signed, Georgeanna Lea, MD, [facility] 06/17/2023 3:08 PM    [facility]

## 2023-06-17 NOTE — Telephone Encounter (Signed)
Patient aware will be in touch to schedule her for BP monitor.   She is aware I spoke to short term disability ([employer]) and the rep specified claim number provided is invalid and was unable to find the patient in their system. Advised the patient to contact human resources from her employer to obtain correct claim number and to obtain new documents to update her [employer] Disability claim.

## 2023-06-18 ENCOUNTER — Ambulatory Visit: Payer: Managed Care, Other (non HMO) | Attending: Cardiology

## 2023-06-18 DIAGNOSIS — I10 Essential (primary) hypertension: Secondary | ICD-10-CM

## 2023-06-19 ENCOUNTER — Telehealth: Payer: Self-pay

## 2023-06-19 NOTE — Telephone Encounter (Signed)
BP monitor device returned, results uploaded, and sent to Dr. Bing Matter for further instructions.

## 2023-06-22 ENCOUNTER — Telehealth: Payer: Self-pay

## 2023-06-22 NOTE — Telephone Encounter (Signed)
Patient notified of results on VM

## 2023-06-22 NOTE — Telephone Encounter (Addendum)
-----   Message from Gypsy Balsam sent at 06/19/2023  1:48 PM EDT ----- Regarding: RE: BP Monitor Please find out exactly how she take her medication ----- Message ----- From: Heywood Bene, CMA Sent: 06/19/2023   1:17 PM EDT To: Georgeanna Lea, MD Subject: BP Monitor                                     BP monitor report on CV Proc, please review and advise   24-hour blood pressure monitor was performed completing 06/18/2023  Average blood pressure 132/79 with an appropriate nocturnal dip of 6.3% systolic 7.3% diastolic.

## 2023-06-22 NOTE — Telephone Encounter (Signed)
-----   Message from Gypsy Balsam sent at 06/05/2023  9:54 AM EDT ----- H&H looks good, continue present management

## 2023-06-22 NOTE — Telephone Encounter (Signed)
-----   Message from Gypsy Balsam sent at 06/19/2023  9:22 AM EDT ----- No significant arrhythmia identified on the monitor

## 2023-06-22 NOTE — Telephone Encounter (Signed)
Patient notified of the following recommendation per Dr. Dulce Sellar.

## 2023-07-23 ENCOUNTER — Encounter: Payer: Self-pay | Admitting: Cardiology

## 2023-07-23 ENCOUNTER — Ambulatory Visit: Payer: Managed Care, Other (non HMO) | Attending: Cardiology | Admitting: Cardiology

## 2023-07-23 VITALS — BP 130/90 | HR 86 | Ht 65.0 in | Wt 224.0 lb

## 2023-07-23 DIAGNOSIS — I517 Cardiomegaly: Secondary | ICD-10-CM

## 2023-07-23 DIAGNOSIS — I10 Essential (primary) hypertension: Secondary | ICD-10-CM

## 2023-07-23 DIAGNOSIS — R0609 Other forms of dyspnea: Secondary | ICD-10-CM

## 2023-07-23 NOTE — Progress Notes (Signed)
Cardiology Office Note:    Date:  07/23/2023   ID:  Kelly Moran, DOB 03-09-1981, MRN [id number]  PCP:  [facility]  Cardiologist:  Gypsy Balsam, MD    Referring MD: [facility]   Chief Complaint  Patient presents with   Follow-up    History of Present Illness:    Kelly Moran is a 42 y.o. female past medical history significant for essential hypertension which is difficult to control, severe left ventricle hypertrophy with wall thickness of 26 mm with significant mid cavitary obliteration.  She did wear monitor which did not show any significant arrhythmia, overall doing quite well try to BL be more active but gets short of breath while walking.  We are waiting for MRI to see if she had any evidence of hypertrophic cardiomyopathy that is not related to high blood pressure.  Past Medical History:  Diagnosis Date   Breast infection 02/10/2023   Chronic pain of left knee 01/07/2017   Dyspnea on exertion 04/22/2023   Essential hypertension 02/10/2023   Hypertension    LVH (left ventricular hypertrophy) 05/21/2023   Onychomycosis due to dermatophyte 03/04/2017   Smoking 04/22/2023   Tinea pedis of both feet 03/04/2017   Unspecified lump in the left breast, lower outer quadrant 02/10/2023    Past Surgical History:  Procedure Laterality Date   ABDOMINAL HYSTERECTOMY  2012   knee sugery Left    TONSILLECTOMY  2004   TUBAL LIGATION      Current Medications: Current Meds  Medication Sig   amLODipine (NORVASC) 5 MG tablet Take 1 tablet (5 mg total) by mouth daily.   cloNIDine (CATAPRES) 0.1 MG tablet Take 1 tablet by mouth 2 (two) times daily. Patient reports only takes 1 in the am and 1 in the evening depending on BP readings   hydrALAZINE (APRESOLINE) 10 MG tablet Take 1 tablet by mouth 2 (two) times daily. Patient reports only takes 1 in the am and 1 in the evening depending on BP readings   hydrochlorothiazide (HYDRODIURIL) 25 MG  tablet Take 25 mg by mouth in the morning.   metoprolol tartrate (LOPRESSOR) 25 MG tablet Take 1 tablet (25 mg total) by mouth 2 (two) times daily. Take 1/2 tablet twice daily for 1 week then 1 tablet twice daily therafter     Allergies:   Atenolol, Aspirin, and Lisinopril   Social History   Socioeconomic History   Marital status: Single    Spouse name: Not on file   Number of children: Not on file   Years of education: Not on file   Highest education level: Not on file  Occupational History   Not on file  Tobacco Use   Smoking status: Every Day    Current packs/day: 0.50    Types: Cigarettes   Smokeless tobacco: Never  Substance and Sexual Activity   Alcohol use: Yes    Comment: Occasional   Drug use: Yes    Types: Marijuana   Sexual activity: Yes    Birth control/protection: Surgical  Other Topics Concern   Not on file  Social History Narrative   Not on file   Social Determinants of Health   Financial Resource Strain: Not on file  Food Insecurity: Not on file  Transportation Needs: Not on file  Physical Activity: Not on file  Stress: Not on file  Social Connections: Not on file     Family History: The patient's family history includes Arthritis in her mother; Asthma in  her mother; Diabetes in her father, mother, and sister; Hypertension in her father, mother, and sister. ROS:   Please see the history of present illness.    All 14 point review of systems negative except as described per history of present illness  EKGs/Labs/Other Studies Reviewed:         Recent Labs: 06/01/2023: Hemoglobin 14.5  Recent Lipid Panel No results found for: "CHOL", "TRIG", "HDL", "CHOLHDL", "VLDL", "LDLCALC", "LDLDIRECT"  Physical Exam:    VS:  BP (!) 130/90 (BP Location: Left Arm, Patient Position: Sitting)   Pulse 86   Ht 5\' 5"  (1.651 m)   Wt 224 lb (101.6 kg)   SpO2 98%   BMI 37.28 kg/m     Wt Readings from Last 3 Encounters:  07/23/23 224 lb (101.6 kg)  06/17/23  220 lb 6.4 oz (100 kg)  06/01/23 217 lb 12.8 oz (98.8 kg)     GEN:  Well nourished, well developed in no acute distress HEENT: Normal NECK: No JVD; No carotid bruits LYMPHATICS: No lymphadenopathy CARDIAC: RRR, no murmurs, no rubs, no gallops RESPIRATORY:  Clear to auscultation without rales, wheezing or rhonchi  ABDOMEN: Soft, non-tender, non-distended MUSCULOSKELETAL:  No edema; No deformity  SKIN: Warm and dry LOWER EXTREMITIES: no swelling NEUROLOGIC:  Alert and oriented x 3 PSYCHIATRIC:  Normal affect   ASSESSMENT:    1. Essential hypertension   2. LVH (left ventricular hypertrophy)   3. Dyspnea on exertion    PLAN:    In order of problems listed above:  Essential hypertension.  Fairly controlled I did review the monitor with her.  Which she is currently right now she will take clonidine 0.1 twice daily, she will skip amlodipine if her blood pressure in the morning is less than 110 systolic, everything else will be the same.  She does have MRI scheduled on 14th November, will wait for results of it. Dyspnea exertion related to diastolic dysfunction secondary to high blood pressure. Left ventricle hypertrophy again MRI is pending.   Medication Adjustments/Labs and Tests Ordered: Current medicines are reviewed at length with the patient today.  Concerns regarding medicines are outlined above.  No orders of the defined types were placed in this encounter.  Medication changes: No orders of the defined types were placed in this encounter.   Signed, Georgeanna Lea, MD, [facility] 07/23/2023 1:15 PM    [facility]

## 2023-07-23 NOTE — Patient Instructions (Signed)
Medication Instructions:  Your physician recommends that you continue on your current medications as directed. Please refer to the Current Medication list given to you today.  *If you need a refill on your cardiac medications before your next appointment, please call your pharmacy*   Lab Work: None Ordered If you have labs (blood work) drawn today and your tests are completely normal, you will receive your results only by: MyChart Message (if you have MyChart) OR A paper copy in the mail If you have any lab test that is abnormal or we need to change your treatment, we will call you to review the results.   Testing/Procedures: None Ordered   Follow-Up: At [facility], you and your health needs are our priority.  As part of our continuing mission to provide you with exceptional heart care, we have created designated Provider Care Teams.  These Care Teams include your primary Cardiologist (physician) and Advanced Practice Providers (APPs -  Physician Assistants and Nurse Practitioners) who all work together to provide you with the care you need, when you need it.  We recommend signing up for the patient portal called "MyChart".  Sign up information is provided on this After Visit Summary.  MyChart is used to connect with patients for Virtual Visits (Telemedicine).  Patients are able to view lab/test results, encounter notes, upcoming appointments, etc.  Non-urgent messages can be sent to your provider as well.   To learn more about what you can do with MyChart, go to [email or web address].    Your next appointment:   4 month(s)  The format for your next appointment:   In Person  Provider:   Robert Krasowski, MD    Other Instructions NA  

## 2023-08-05 ENCOUNTER — Telehealth (HOSPITAL_COMMUNITY): Payer: Self-pay | Admitting: *Deleted

## 2023-08-05 NOTE — Telephone Encounter (Signed)
Reaching out to patient to offer assistance regarding upcoming cardiac imaging study; pt verbalizes understanding of appt date/time, parking situation and where to check in, and verified current allergies; name and call back number provided for further questions should they arise  Larey Brick RN Navigator Cardiac Imaging [facility] [phone] office [phone] cell  Patient denies metal but is unsure about claustrophobia.

## 2023-08-06 ENCOUNTER — Ambulatory Visit (HOSPITAL_COMMUNITY)
Admission: RE | Admit: 2023-08-06 | Discharge: 2023-08-06 | Disposition: A | Discharge status: Home / Self Care | Payer: Managed Care, Other (non HMO) | Source: Ambulatory Visit | Attending: Cardiology | Admitting: Cardiology

## 2023-08-06 ENCOUNTER — Other Ambulatory Visit: Payer: Self-pay | Admitting: Cardiology

## 2023-08-06 DIAGNOSIS — I517 Cardiomegaly: Secondary | ICD-10-CM

## 2023-08-06 MED ORDER — GADOBUTROL 1 MMOL/ML IV SOLN
10.0000 mL | Freq: Once | INTRAVENOUS | Status: AC | PRN
  Administered 2023-08-06: 10 mL via INTRAVENOUS

## 2023-08-07 ENCOUNTER — Telehealth: Payer: Self-pay | Admitting: Cardiology

## 2023-08-07 NOTE — Telephone Encounter (Signed)
Cardiac MRI completed yesterday 08/06/23   Advised that reading physician has completed but primary cardiologist has final review.   Advised will send to MD and she'll be contacted with the results

## 2023-08-07 NOTE — Telephone Encounter (Signed)
Patient would like to know if she is cleared to return to work tomorrow based on MRI results.

## 2023-08-10 ENCOUNTER — Telehealth: Payer: Self-pay | Admitting: Cardiology

## 2023-08-10 NOTE — Telephone Encounter (Signed)
Pt is requesting call back from front desk receptionist Vira Blanco in regards to this matter.

## 2023-08-10 NOTE — Telephone Encounter (Signed)
Pt spoke with Loistine Simas and is on the phone with benefits. Advised that we have not heard anything regarding return to work from Dr. Bing Matter.

## 2023-08-10 NOTE — Telephone Encounter (Signed)
Pt would like a c/b regarding return to work note. Please advise

## 2023-08-12 ENCOUNTER — Encounter: Payer: Self-pay | Admitting: Cardiology

## 2023-08-12 ENCOUNTER — Ambulatory Visit: Payer: Managed Care, Other (non HMO) | Attending: Cardiology | Admitting: Cardiology

## 2023-08-12 ENCOUNTER — Telehealth (HOSPITAL_COMMUNITY): Payer: Self-pay | Admitting: *Deleted

## 2023-08-12 VITALS — BP 124/70 | HR 63 | Ht 65.0 in | Wt 224.8 lb

## 2023-08-12 DIAGNOSIS — I517 Cardiomegaly: Secondary | ICD-10-CM

## 2023-08-12 DIAGNOSIS — R0609 Other forms of dyspnea: Secondary | ICD-10-CM | POA: Diagnosis not present

## 2023-08-12 DIAGNOSIS — I10 Essential (primary) hypertension: Secondary | ICD-10-CM | POA: Diagnosis not present

## 2023-08-12 NOTE — Telephone Encounter (Signed)
 Pt has appt 08/12/23

## 2023-08-12 NOTE — Telephone Encounter (Signed)
After speaking with Dr Bing Matter pt called and told not to hold her metoprolol.

## 2023-08-12 NOTE — Progress Notes (Signed)
Cardiology Office Note:    Date:  08/12/2023   ID:  Kelly Moran, DOB 07/28/1981, MRN [id number]  PCP:  [facility]  Cardiologist:  Gypsy Balsam, MD    Referring MD: [facility]   Chief Complaint  Patient presents with   Work status    History of Present Illness:    Kelly Moran is a 42 y.o. female past medical history significant for essential hypertension which was difficult to control, severe left ventricle hypertrophy with wall thickness 26 mm with significant neck cavitary obliteration, she did her monitor which did not show any significant arrhythmia, she did have MRI which did not show any late gadolinium enhancement.  Still confirmed presence of mid cavitary lesion.  She comes today to my office overall doing well.  Blood presumed to well-controlled.  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  Overall she denies have any chest pain tightness squeezing pressure burning chest no palpitations no dizziness no palpitations no passing out  Past Medical History:  Diagnosis Date   Breast infection 02/10/2023   Chronic pain of left knee 01/07/2017   Dyspnea on exertion 04/22/2023   Essential hypertension 02/10/2023   Hypertension    LVH (left ventricular hypertrophy) 05/21/2023   Onychomycosis due to dermatophyte 03/04/2017   Smoking 04/22/2023   Tinea pedis of both feet 03/04/2017   Unspecified lump in the left breast, lower outer quadrant 02/10/2023    Past Surgical History:  Procedure Laterality Date   ABDOMINAL HYSTERECTOMY  2012   knee sugery Left    TONSILLECTOMY  2004   TUBAL LIGATION      Current Medications: Current Meds  Medication Sig   amLODipine (NORVASC) 5 MG tablet Take 1 tablet (5 mg total) by mouth daily.   cloNIDine (CATAPRES) 0.1 MG tablet Take 1 tablet by mouth 2 (two) times daily. Patient reports only takes 1 in the am  and 1 in the evening depending on BP readings   hydrALAZINE (APRESOLINE) 10 MG tablet Take 1 tablet by mouth 2 (two) times daily. Patient reports only takes 1 in the am and 1 in the evening depending on BP readings   hydrochlorothiazide (HYDRODIURIL) 25 MG tablet Take 25 mg by mouth in the morning.   metoprolol tartrate (LOPRESSOR) 25 MG tablet Take 1 tablet (25 mg total) by mouth 2 (two) times daily. Take 1/2 tablet twice daily for 1 week then 1 tablet twice daily therafter     Allergies:   Atenolol, Aspirin, and Lisinopril   Social History   Socioeconomic History   Marital status: Single    Spouse name: Not on file   Number of children: Not on file   Years of education: Not on file   Highest education level: Not on file  Occupational History   Not on file  Tobacco Use   Smoking status: Every Day    Current packs/day: 0.50    Types: Cigarettes   Smokeless tobacco: Never  Substance and Sexual Activity   Alcohol use: Yes    Comment: Occasional   Drug use: Yes    Types: Marijuana   Sexual activity: Yes    Birth control/protection: Surgical  Other Topics Concern   Not on file  Social History Narrative   Not on file   Social Determinants of Health   Financial Resource Strain: Not on file  Food Insecurity: Not  on file  Transportation Needs: Not on file  Physical Activity: Not on file  Stress: Not on file  Social Connections: Not on file     Family History: The patient's family history includes Arthritis in her mother; Asthma in her mother; Diabetes in her father, mother, and sister; Hypertension in her father, mother, and sister. ROS:   Please see the history of present illness.    All 14 point review of systems negative except as described per history of present illness  EKGs/Labs/Other Studies Reviewed:         Recent Labs: 06/01/2023: Hemoglobin 14.5  Recent Lipid Panel No results found for: "CHOL", "TRIG", "HDL", "CHOLHDL", "VLDL", "LDLCALC",  "LDLDIRECT"  Physical Exam:    VS:  BP 124/70 (BP Location: Left Arm, Patient Position: Sitting)   Pulse 63   Ht 5\' 5"  (1.651 m)   Wt 224 lb 12.8 oz (102 kg)   SpO2 96%   BMI 37.41 kg/m     Wt Readings from Last 3 Encounters:  08/12/23 224 lb 12.8 oz (102 kg)  07/23/23 224 lb (101.6 kg)  06/17/23 220 lb 6.4 oz (100 kg)     GEN:  Well nourished, well developed in no acute distress HEENT: Normal NECK: No JVD; No carotid bruits LYMPHATICS: No lymphadenopathy CARDIAC: RRR, systolic murmur grade 2/6 best heard right upper portion of the sternum, no rubs, no gallops RESPIRATORY:  Clear to auscultation without rales, wheezing or rhonchi  ABDOMEN: Soft, non-tender, non-distended MUSCULOSKELETAL:  No edema; No deformity  SKIN: Warm and dry LOWER EXTREMITIES: no swelling NEUROLOGIC:  Alert and oriented x 3 PSYCHIATRIC:  Normal affect   ASSESSMENT:    1. LVH (left ventricular hypertrophy)   2. Essential hypertension   3. Dyspnea on exertion    PLAN:    In order of problems listed above:  Hypertrophic cardiomyopathy with mid cavitary lesion, MRI did not show any significantly granularly enhancement.  She did confirm presence of mid cavitary obstruction but not critical.  She is doing overall clinically very well, monitor did not show any significant arrhythmia, blood pressure seems to be well-controlled.  I did talk to Dr.Chandrasekhar who is a hypertrophic cardiomyopathy specialist.  We both with conclusion that probably safe approach to let him go back to work will be to do treadmill stress test with echocardiogram to see if she get any episodes of hypotension, ventricular tachycardia or syncope obviously if that is the case then we will be out of question for her to work if she does frequently well then we will be able to release her to go to work. Essential hypertension: Blood pressure well-controlled continue present management. Dyspnea on exertion will objectively assess her by  doing stress test.   Medication Adjustments/Labs and Tests Ordered: Current medicines are reviewed at length with the patient today.  Concerns regarding medicines are outlined above.  No orders of the defined types were placed in this encounter.  Medication changes: No orders of the defined types were placed in this encounter.   Signed, Georgeanna Lea, MD, [facility] 08/12/2023 1:09 PM    [facility]

## 2023-08-12 NOTE — Telephone Encounter (Signed)
Spoke to patient and told her not to take metoprolol

## 2023-08-12 NOTE — Patient Instructions (Signed)
Medication Instructions:  Your physician recommends that you continue on your current medications as directed. Please refer to the Current Medication list given to you today.  *If you need a refill on your cardiac medications before your next appointment, please call your pharmacy*   Lab Work: None Ordered If you have labs (blood work) drawn today and your tests are completely normal, you will receive your results only by: MyChart Message (if you have MyChart) OR A paper copy in the mail If you have any lab test that is abnormal or we need to change your treatment, we will call you to review the results.   Testing/Procedures: Stress Echocardiogram Instructions:    1. You may take all of your medications except Metoprolol (hold beta blockers).  2. No food, drink or tobacco products four hours prior to your test.  3. Dress prepared to exercise. Best to wear 2 piece outfit and tennis shoes. Shoes must be closed toe.  4. Please bring all current prescription medications.    Follow-Up: At [facility], you and your health needs are our priority.  As part of our continuing mission to provide you with exceptional heart care, we have created designated Provider Care Teams.  These Care Teams include your primary Cardiologist (physician) and Advanced Practice Providers (APPs -  Physician Assistants and Nurse Practitioners) who all work together to provide you with the care you need, when you need it.  We recommend signing up for the patient portal called "MyChart".  Sign up information is provided on this After Visit Summary.  MyChart is used to connect with patients for Virtual Visits (Telemedicine).  Patients are able to view lab/test results, encounter notes, upcoming appointments, etc.  Non-urgent messages can be sent to your provider as well.   To learn more about what you can do with MyChart, go to [email or web address].    Your next appointment:   3 month(s)  The format for your  next appointment:   In Person  Provider:   Gypsy Balsam, MD    Other Instructions NA

## 2023-08-12 NOTE — Addendum Note (Signed)
Addended by: Baldo Ash D on: 08/12/2023 01:25 PM   Modules accepted: Orders

## 2023-08-13 ENCOUNTER — Ambulatory Visit: Payer: Managed Care, Other (non HMO)

## 2023-08-13 ENCOUNTER — Other Ambulatory Visit: Payer: Managed Care, Other (non HMO)

## 2023-08-13 DIAGNOSIS — I517 Cardiomegaly: Secondary | ICD-10-CM | POA: Diagnosis not present

## 2023-08-13 MED ORDER — PERFLUTREN LIPID MICROSPHERE
1.0000 mL | INTRAVENOUS | Status: AC | PRN
  Administered 2023-08-13: 5 mL via INTRAVENOUS

## 2023-08-17 ENCOUNTER — Telehealth: Payer: Self-pay

## 2023-08-17 NOTE — Telephone Encounter (Signed)
Left message on My Chart with 24 hr BP monitor results per Dr. Vanetta Shawl note. Routed to PCP.

## 2023-10-19 ENCOUNTER — Ambulatory Visit: Payer: Managed Care, Other (non HMO) | Admitting: Genetic Counselor

## 2023-10-19 ENCOUNTER — Ambulatory Visit: Payer: Managed Care, Other (non HMO) | Attending: Internal Medicine | Admitting: Internal Medicine

## 2023-10-19 VITALS — BP 138/90 | HR 78 | Ht 65.0 in | Wt 227.0 lb

## 2023-10-19 DIAGNOSIS — F17200 Nicotine dependence, unspecified, uncomplicated: Secondary | ICD-10-CM

## 2023-10-19 DIAGNOSIS — F1290 Cannabis use, unspecified, uncomplicated: Secondary | ICD-10-CM

## 2023-10-19 DIAGNOSIS — I422 Other hypertrophic cardiomyopathy: Secondary | ICD-10-CM

## 2023-10-19 DIAGNOSIS — I1A0 Resistant hypertension: Secondary | ICD-10-CM

## 2023-10-19 DIAGNOSIS — E876 Hypokalemia: Secondary | ICD-10-CM

## 2023-10-19 DIAGNOSIS — IMO0001 Reserved for inherently not codable concepts without codable children: Secondary | ICD-10-CM

## 2023-10-19 HISTORY — DX: Other hypertrophic cardiomyopathy: I42.2

## 2023-10-19 HISTORY — DX: Morbid (severe) obesity due to excess calories: E66.01

## 2023-10-19 HISTORY — DX: Cannabis use, unspecified, uncomplicated: F12.90

## 2023-10-19 HISTORY — DX: Hypokalemia: E87.6

## 2023-10-19 MED ORDER — DILTIAZEM HCL ER COATED BEADS 180 MG PO CP24: 180.0000 mg | ORAL_CAPSULE | Freq: Every day | ORAL | 3 refills | Status: DC

## 2023-10-19 NOTE — Patient Instructions (Signed)
Medication Instructions:  Your physician has recommended you make the following change in your medication:  STOP: metoprolol STOP: amlodipine (Norvasc) START: diltiazem (Cardizem) 180 mg by mouth once daily  *If you need a refill on your cardiac medications before your next appointment, please call your pharmacy*   Lab Work: IN 2 WEEKS: BMP, aldosterone renin with ratio  If you have labs (blood work) drawn today and your tests are completely normal, you will receive your results only by: MyChart Message (if you have MyChart) OR A paper copy in the mail If you have any lab test that is abnormal or we need to change your treatment, we will call you to review the results.   Testing/Procedures: Your physician has referred you to meet with out Health Coach to discuss smoking cessation.    Follow-Up: At [facility], you and your health needs are our priority.  As part of our continuing mission to provide you with exceptional heart care, we have created designated Provider Care Teams.  These Care Teams include your primary Cardiologist (physician) and Advanced Practice Providers (APPs -  Physician Assistants and Nurse Practitioners) who all work together to provide you with the care you need, when you need it.   Your next appointment:   11 month(s)  Provider:   Riley Lam, MD

## 2023-10-19 NOTE — Progress Notes (Signed)
Cardiology Office Note:  .    Date:  10/19/2023  ID:  Kelly Moran, DOB August 20, 1981, MRN [id number] PCP: [facility]  [facility] Providers Cardiologist:  None     CC: Mid Cavitary HCM Consulted for the evaluation of Mid Cavitary HCM at the behest of Dr. Bing Matter   History of Present Illness: Kelly Moran Kitchen    Kelly Moran is a 43 y.o. female diagnosed with hypertrophic cardiomyopathy, presents with concerns of elevated blood pressure and intermittent episodes of tachycardia. They report periods of feeling well, but also experience episodes of rapid heart rate and shortness of breath, even at rest. These episodes are described as feeling like their heart is "beating up" and they have to take deep breaths to regain control. The patient notes that these episodes have become less frequent since starting metoprolol, but they still occur occasionally.  The patient also reports significant fatigue, which they attribute to their current medication regimen, including metoprolol, clonidine, and amlodipine. They express a desire to be less sedentary and more active, but the fatigue and a recent weight gain have limited their physical activity.  The patient has a significant family history of kidney failure and hypertension, with both parents having died on dialysis. Two of their siblings have also experienced kidney failure, with one currently on dialysis and the other having received a kidney transplant.  The patient has a 20-year history of smoking, currently about half a pack per day, and also admits to marijuana use. They express some willingness to quit smoking in the future, but also note that smoking is a coping mechanism for their stress and anxiety.  The patient's children, aged 55, 75, and 13, have not yet been screened for hypertrophic cardiomyopathy. The patient is aware of the need for their children to undergo testing, either through echocardiogram or genetic  testing.  In summary, the patient with hypertrophic cardiomyopathy presents with concerns of elevated blood pressure, intermittent tachycardia, and significant fatigue. They have a strong family history of kidney disease and hypertension, and a personal history of long-term tobacco and marijuana use. The patient's current medication regimen may be contributing to their fatigue and limited physical activity. Their children have not yet been screened for hypertrophic cardiomyopathy.   Relevant histories: .  Social  - Mother had kidney failure, was on dialysis and died on dialysis - Sister had kidney failure, was on dialysis; sister 2 is s/p transplant all were diabetic ROS: As per HPI.   Studies Reviewed: .   Cardiac Studies & Procedures     STRESS TESTS  ECHOCARDIOGRAM STRESS TEST 08/13/2023  Narrative EXERCISE STRESS ECHO REPORT   --------------------------------------------------------------------------------  Patient Name:   Kelly Moran Date of Exam: 08/13/2023 Medical Rec #:  [id number]        Height:       65.0 in Accession #:    [id number]       Weight:       224.8 lb Date of Birth:  08/05/1981         BSA:          2.079 m Patient Age:    42 years         BP:           124/70 mmHg Patient Gender: F                HR:           76 bpm. Exam Location:  [place]  Procedure:  Stress Echo  Indications:    LVH (left ventricular hypertrophy) [I51.7 (ICD-10-CM)]  History:        Patient has prior history of Echocardiogram examinations, most recent 05/21/2023.  Sonographer:    Louie Boston RDCS Referring Phys: [phone] Marveen Reeks KRASOWSKI  IMPRESSIONS   1. This is an inconclusive stress echocardiogram for ischemia. Non-diagnostic study for ischemia due to inadequate heart rate response. 2. 83.8% of maximum predicted heart rate attained. 3. There was hypertensive blood pressure response with exertion. 4. Occasional PVCs were observed during exercise. 5. Nondiagnostic EKG  portion of the study due to baseline ST-T changes. 6. No stress induced wall motion abnormalities noted at peak heart rate attained. No findings on stress echocardiography to suggest ischemia at heart rate attained. 7. Unable to assess cardiovascular risk based on this study. 8. LV morphology consistent with severe hypertrophy. There is no chamber collapse. No Doppler assessment done on this study to assess dynamic gradients.  - This is an indeterminate risk study.  FINDINGS  Exam Protocol: The patient exercised on a treadmill according to a Bruce protocol. Definity contrast agent was given IV to delineate the left ventricular endocardial borders.   Patient Performance: The patient exercised for 6 minutes and 48 seconds, achieving 8.2 METS. The maximum stage achieved was III of the Bruce protocol. The baseline heart rate was 85 bpm. The heart rate at peak stress was 148 bpm. The target heart rate was calculated to be 150 bpm. The percentage of maximum predicted heart rate achieved was 83.8 %. The baseline blood pressure was 137/96 mmHg. The blood pressure at peak stress was 236/107 mmHg. The blood pressure response was hypertensive. The patient developed fatigue, shortness of breath and dizziness during the stress exam.  EKG: Resting EKG showed normal sinus rhythm with left ventricular hypertrophy with strain. The patient developed occasional premature ventricular contractions and ST-T changes observed at baseline and artifact during exercise.   2D Echo Findings: The baseline ejection fraction was 65-70%. The peak ejection fraction at stress was 70-75%. Baseline regional wall motion abnormalities were not present. This is an inconclusive stress echocardiogram for ischemia. This is a non-diagnostic study for ischemia due to inadequate heart rate response.   Sreedhar reddy Madireddy Electronically signed on 08/13/2023 at 4:02:25 PM     Final  ECHOCARDIOGRAM  ECHOCARDIOGRAM COMPLETE  05/21/2023  Narrative ECHOCARDIOGRAM REPORT    Patient Name:   Kelly Moran Date of Exam: 05/21/2023 Medical Rec #:  [id number]        Height:       65.0 in Accession #:    [id number]       Weight:       215.0 lb Date of Birth:  1981-07-10         BSA:          2.040 m Patient Age:    42 years         BP:           122/90 mmHg Patient Gender: F                HR:           67 bpm. Exam Location:  [place]  Procedure: 2D Echo, Cardiac Doppler, Color Doppler and Strain Analysis  Indications:    Dyspnea on exertion [R06.09 (ICD-10-CM)]  History:        Patient has no prior history of Echocardiogram examinations. Risk Factors:multiple family members with chronic kidney failure and Current Smoker.  Sonographer:    Louie Boston RDCS Referring Phys: [id number] ROBERT J KRASOWSKI  IMPRESSIONS   1. IVSD 26 mm Systolic mid cavity obliteration LV midcavitary velocity 3 m/sce 35 mm Hg at rest. Left ventricular ejection fraction, by estimation, is >75%. The left ventricle has hyperdynamic function. The left ventricle has no regional wall motion abnormalities. There is severe asymmetric left ventricular hypertrophy. Left ventricular diastolic parameters are consistent with Grade I diastolic dysfunction (impaired relaxation). Elevated left ventricular end-diastolic pressure. The average left ventricular global longitudinal strain is -13.0 %. The global longitudinal strain is abnormal. 2. Right ventricular systolic function is normal. The right ventricular size is normal. Tricuspid regurgitation signal is inadequate for assessing PA pressure. 3. Left atrial size was mildly dilated. 4. The mitral valve is normal in structure. Mild mitral valve regurgitation. No evidence of mitral stenosis. 5. The aortic valve is tricuspid. Aortic valve regurgitation is not visualized. No aortic stenosis is present. 6. Aortic DTA is normal. 7. The inferior vena cava is normal in size with greater than 50% respiratory  variability, suggesting right atrial pressure of 3 mmHg.  FINDINGS Left Ventricle: IVSD 26 mm Systolic mid cavity obliteration LV midcavitary velocity 3 m/sce 35 mm Hg at rest. Left ventricular ejection fraction, by estimation, is >75%. The left ventricle has hyperdynamic function. The left ventricle has no regional wall motion abnormalities. The average left ventricular global longitudinal strain is -13.0 %. The global longitudinal strain is abnormal. The left ventricular internal cavity size was normal in size. There is severe asymmetric left ventricular hypertrophy. Left ventricular diastolic parameters are consistent with Grade I diastolic dysfunction (impaired relaxation). Elevated left ventricular end-diastolic pressure.  Right Ventricle: The right ventricular size is normal. No increase in right ventricular wall thickness. Right ventricular systolic function is normal. Tricuspid regurgitation signal is inadequate for assessing PA pressure. The tricuspid regurgitant velocity is 1.74 m/s, and with an assumed right atrial pressure of 3 mmHg, the estimated right ventricular systolic pressure is 15.1 mmHg.  Left Atrium: Left atrial size was mildly dilated.  Right Atrium: Right atrial size was normal in size.  Pericardium: There is no evidence of pericardial effusion.  Mitral Valve: The mitral valve is normal in structure. Mild mitral valve regurgitation. No evidence of mitral valve stenosis.  Tricuspid Valve: The tricuspid valve is normal in structure. Tricuspid valve regurgitation is trivial. No evidence of tricuspid stenosis.  Aortic Valve: The aortic valve is tricuspid. Aortic valve regurgitation is not visualized. No aortic stenosis is present.  Pulmonic Valve: The pulmonic valve was normal in structure. Pulmonic valve regurgitation is not visualized. No evidence of pulmonic stenosis.  Aorta: The aortic root and ascending aorta are structurally normal, with no evidence of dilitation,  the aortic arch was not well visualized and DTA is normal.  Venous: A normal flow pattern is recorded from the right upper pulmonary vein. The inferior vena cava is normal in size with greater than 50% respiratory variability, suggesting right atrial pressure of 3 mmHg.  IAS/Shunts: No atrial level shunt detected by color flow Doppler.   LEFT VENTRICLE PLAX 2D LVIDd:         3.90 cm   Diastology LVIDs:         2.30 cm   LV e' medial:    5.11 cm/s LV PW:         1.85 cm   LV E/e' medial:  14.6 LV IVS:        2.10 cm   LV e' lateral:  3.81 cm/s LVOT diam:     2.00 cm   LV E/e' lateral: 19.5 LV SV:         94 LV SV Index:   46        2D Longitudinal Strain LVOT Area:     3.14 cm  2D Strain GLS Avg:     -13.0 %   RIGHT VENTRICLE             IVC RV Basal diam:  2.80 cm     IVC diam: 1.30 cm RV S prime:     15.80 cm/s TAPSE (M-mode): 2.7 cm  LEFT ATRIUM             Index        RIGHT ATRIUM           Index LA diam:        4.30 cm 2.11 cm/m   RA Area:     11.80 cm LA Vol (A2C):   64.8 ml 31.77 ml/m  RA Volume:   23.70 ml  11.62 ml/m LA Vol (A4C):   74.3 ml 36.42 ml/m LA Biplane Vol: 74.4 ml 36.47 ml/m AORTIC VALVE LVOT Vmax:   158.33 cm/s LVOT Vmean:  98.700 cm/s LVOT VTI:    0.299 m  AORTA Ao Root diam: 3.20 cm Ao Asc diam:  2.90 cm Ao Desc diam: 2.30 cm  MV E velocity: 74.40 cm/s   TRICUSPID VALVE MV A velocity: 102.00 cm/s  TR Peak grad:   12.1 mmHg MV E/A ratio:  0.73         TR Vmax:        174.00 cm/s  SHUNTS Systemic VTI:  0.30 m Systemic Diam: 2.00 cm  Norman Herrlich MD Electronically signed by Norman Herrlich MD Signature Date/Time: 05/21/2023/12:19:13 PM    Final   MONITORS  LONG TERM MONITOR (3-14 DAYS) 06/05/2023  Narrative Patch Wear Time:  12 days and 4 hours (2024-08-29T16:46:22-0400 to 2024-09-10T20:46:52-0400)  Patient had a min HR of 60 bpm, max HR of 182 bpm, and avg HR of 89 bpm. Predominant underlying rhythm was Sinus Rhythm. 2  Supraventricular Tachycardia runs occurred, the run with the fastest interval lasting 5 beats with a max rate of 182 bpm, the longest lasting 5 beats with an avg rate of 151 bpm. Some episodes of Supraventricular Tachycardia may be possible Atrial Tachycardia with variable block. Isolated SVEs were rare (<1.0%), SVE Couplets were rare (<1.0%), and SVE Triplets were rare (<1.0%). Isolated VEs were rare (<1.0%), VE Couplets were rare (<1.0%), and no VE Triplets were present. Ventricular Bigeminy and Trigeminy were present.  Summary and conclusions: 2 episode of supraventricular tachycardia noted longest episode 5 beats. Rare ventricular ectopy was noted with ventricular bigeminy and trigeminy Triggered events for PVCs.   CARDIAC MRI  MR CARDIAC MORPHOLOGY W WO CONTRAST 08/06/2023  Narrative CLINICAL DATA:  LVH r/o hypertrophic cardiomyopathy  EXAM: CARDIAC MRI  TECHNIQUE: The patient was scanned on a 1.5 Tesla Siemens magnet. A dedicated cardiac coil was used. Functional imaging was done using Fiesta sequences. 2,3, and 4 chamber views were done to assess for RWMA's. Modified Simpson's rule using a short axis stack was used to calculate an ejection fraction on a dedicated work [employer]. The patient received 10 cc of Gadavist. After 10 minutes inversion recovery sequences were used to assess for infiltration and scar tissue.  CONTRAST:  10mL GADAVIST GADOBUTROL 1 MMOL/ML IV SOLN  FINDINGS: Mild LAE. Normal RA/RV. No  pericardial effusion. Normal ascending thoracic aorta 3.2 cm. Tri leaflet normal aortic valve. No systolic anterior motion of the anterior mitral leaflet. Mild appearing MR. Small spade like cavity with mid cavitary turbulence. Mid cavitary gradient likely from apposition of hypertrophied papillary muscle. Papillary muscles hypertrophied and apically displaced. 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 Delayed  enhancement images with no gadolinium uptake.  Quantitative LVEF: 64% (EDV 180 cc ESV 65 cc SV 115 cc) Septal thickness 18 mm lateral wall thickness 9 mm Estimated cardiac output 3.3 L/min  Quantitative RVEF: 58% (EDV 139 cc ESV 59 cc SV 80 cc )  Parametric Measures: Using Hct of 43  T1 normal 1030 msec  ECV normal minimally elevated 31%  T2 normal 49 msec  IMPRESSION: 1. Hypertrophied LV with small spade like ventricle no SAM but mid cavitary turbulence with apically displaced hypertrophied papillary muscles consistent with mid/apical variant hypertrophic cardiomyopathy  2.  Septal thickness 18 mm lateral wall thickness 9 mm.  LVEF 64%  3.  Normal RV RVEF 58%  4.  Mild LAE  5.  Mild MR  6.  No late gadolinium uptake on inversion recovery sequences  7.  Estimated cardiac output 3.3 L/min  8. Normal T1/T2 with minimally elevated ECV most consistent with fibrosis not infiltrative cardiomyopathy  Charlton Haws   Electronically Signed By: Charlton Haws M.D. On: 08/06/2023 17:08          Physical Exam:    VS:  BP (!) 138/90 (BP Location: Left Arm)   Pulse 78   Ht 5\' 5"  (1.651 m)   Wt 227 lb (103 kg)   SpO2 95%   BMI 37.77 kg/m    Wt Readings from Last 3 Encounters:  10/19/23 227 lb (103 kg)  08/12/23 224 lb 12.8 oz (102 kg)  07/23/23 224 lb (101.6 kg)    Gen: No distress, morbid obesity  Neck: No JVD Cardiac: No Rubs or Gallops, systolic murmur only with standing Valsalva, RRR +2adial pulses Respiratory: Clear to auscultation bilaterally, normal effort, normal  respiratory rate GI: Soft, nontender, non-distended  MS: No  edema;  moves all extremities Integument: Skin feels warm Neuro:  At time of evaluation, alert and oriented to person/place/time/situation  Psych: Normal affect, patient feels ok   ASSESSMENT AND PLAN: .    Hypertrophic Cardiomyopathy Hypertrophic cardiomyopathy with a heart wall thickness of 18mm. Reports intermittent palpitations  and fatigue, but no significant shortness of breath or syncope. Current medications include metoprolol, clonidine, and amlodipine. Discussed medication-induced fatigue and potential consolidation to reduce side effects. Risk of sudden cardiac death is approximately 1.9%. Discussed genetic testing for family screening and future non-invasive treatments. - NYHA I-II; I suspect her sx are not HCM related - Stop metoprolol, amlodipine and start diltiazem 180 mg extended release - Order BMP and Aldo Renin ratio in 2 weeks - Refer to Dr. Jomarie Longs for genetic testing and family screening  Supraventricular Tachycardia Episodes of palpitations and racing heart, partially controlled with metoprolol. Diltiazem may manage both heart rate and blood pressure. Monitoring heart rate and symptoms post-switch is crucial. - Monitor heart rate and symptoms after switching to diltiazem  Resistant Hypertension Long-standing hypertension with a family history of renal failure and dialysis. Blood pressure remains elevated despite multiple antihypertensives. Discussed smoking's role in hypertension and potential underlying hypoaldosteronism. Smoking cessation could lower blood pressure and improve cardiovascular health. - Monitor blood pressure at home, especially in the morning and after work - Consider spironolactone if BMP and Aldo  Renin ratio indicate hypoaldosteronism - Encourage smoking cessation and provide support through a healthcare coach  Tobacco and Marijuana Use Long-term tobacco and marijuana use contributing to elevated blood pressure and cardiovascular risks. Expresses desire to quit smoking but cites stress as a barrier. Discussed benefits of quitting smoking on blood pressure and overall health, and potential increased marijuana use as a coping mechanism. - Encourage smoking cessation - Refer to healthcare coach for support - Discuss potential benefits of quitting smoking on blood pressure and overall  health  General Health Maintenance Discussed importance of a balanced diet and regular exercise. Has dietary restrictions limiting potassium-rich foods. A nutritionist can help develop a diet plan meeting nutritional needs while accommodating preferences. - attempted dietary counseling - Encourage regular physical activity to manage stress and improve cardiovascular health  Follow-up - Follow-up appointment in winter 2025; there may be newer therapies for non-invasive HCM we may consider - Monitor blood pressure and symptoms regularly  Time Spent Directly with Patient:   I have spent a total of 54 minutes with the patient reviewing notes, imaging, EKGs, labs, and examining the patient as well as establishing an assessment and plan that was discussed personally with the patient. Discussed disease state education, using cardiac modeling, and discussing positive coping mechanism . Reviewed care and plan in collaboration with clinical genetecist   Riley Lam, MD FASE [facility] Cardiologist [facility]  [address] [place], [address] [place], [place] [address] [phone]  2:07 PM

## 2023-10-20 ENCOUNTER — Telehealth: Payer: Self-pay

## 2023-10-20 DIAGNOSIS — Z0000 Encounter for general adult medical examination without abnormal findings: Secondary | ICD-10-CM

## 2023-10-20 NOTE — Telephone Encounter (Signed)
Called patient per health coaching referral from Dr. Kenn File for smoking cessation. Went straight to [employer]. Left message for patient to return call to discuss health coaching.   Renaee Munda, MS, ERHD, [facility]  Care Guide, Health & Wellness Coach [address] [place] [address] Telephone: [phone] Email: [email or web address]

## 2023-10-27 NOTE — Progress Notes (Cosign Needed)
Pre Test Genetic Consult  Referral Reason  Kelly Moran, a new HCM patient, is referred for genetic consult and testing of hypertrophic cardiomyopathy.   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 She was found to have a heart murmur in the summer of 2024 and was referred to Dr. Shawn Route who later referred her to the HCM clinic at [facility]. Cardiac imaging studies demonstrated severe LVH of 2.6 cm with cavitary obliteration and no scar burden.  She reports having symptoms of dyspnea, fatigue that she attributes to her HTN medication, occasional heart palpitations, chest discomfort, and reports a syncopal event in 2015 that she suspects was related to her HTN.  Traditional Risk Factors Kelly Moran was diagnosed with HTN at age 32 that previously was labile and is now controlled to some extent with medication but, she states continues to trends high.  Family history Kelly Moran reports significant family history of ESRD in several paternal relatives, including her father, 2 sisters and paternal grandmother AS  well as mother. Briefly touched upon ADPKD and tells me that this condition has not been discussed by the medical providers taking care of her family.  Relation to Proband Pedigree # Current age Heart condition/age of onset Notes  Son IV.8 25 None Echo/EKG to be done  Daughters, 2 IV.9, IV.10 23, 16 None Echo/EKG to be done  Kelly Moran, 3 III.1- III.3 60, 56, 54 None III.1- Lupus III.2-ESRD> Kidney TxP @  III.3- ESRD> dialysis for 5 mos        Father II.2 Deceased None Died @ 43-from M.I during dialysis for ESRD  Paternal uncle II.1 Deceased None Sudden death @ 34 while playing basketball  Paternal grandfather 1.1 Deceased None Died @ 85s-old age  Paternal grandmother I.2 Deceased None Died @ ?- ESRD        Mother II.3 Decease None Died @ 72-from M.I during  dialysis for ESRD  Maternal aunts, 2 II.4-II.5 Deceased None II.4- died of cancer @ 78 II.5- died of old age @ 65  Maternal uncle II.6 Deceased None Died of brain hemorrhage @ 59s  Maternal grandfather I.3 Deceased None Died @ 25s- lung cancer  Maternal grandmother I.4 Deceased None Died @ 61s- cardiac arrest   Genetics Kelly Moran was counseled on the genetics of hypertrophic cardiomyopathy (HCM). I explained to the patient that this is an autosomal dominant condition with incomplete penetrance i.e. not all individuals harboring the HCM mutation will present clinically with HCM, and age-related penetrance where clinical presentation of HCM increases with advanced age. Variability in clinical expression is also seen in families with HCM with affected family members presenting clinically at different ages and with symptoms ranging from mild to severe.  Since HCM is an autosomal dominant condition, first degree-relatives are at a 50% risk of inheriting this condition. They should seek regular surveillance for HCM.  First-degree relatives include her son and two daughters.   Clinical screening of first-degree relatives involves echocardiogram and EKG at regular intervals, frequency is typically determined by age, with children undergoing screening every year until the age of 61 and those over the age of 69 getting screened every 3-5 years until the age of 37. Patient verbalized understanding of this.  Also briefly discussed the inheritance pattern and treatment /management plans for the infiltrative cardiomyopathies that present as HCM phenocopies. About 8-10% of HCM patients  can have compound and digenic sarcomeric mutations for HCM.  Patient should be aware that genetic testing is a probabilistic test dependent upon age and severity of presentation, presence of risk factors for HCM and importantly family history of HCM or sudden death in first-degree relatives. The potential outcomes of genetic testing  and subsequent management of at-risk family members is listed below-  If a mutation is not identified then it is important that he understands that HCM is a genetic condition and can be passed down to his children. All first-degree relatives should undergo regular screening for HCM.  A negative test result can be due to limitations of the genetic test.   There is also the likelihood of identifying a "Variant of unknown significance". This result means that the variant has not been detected in a statistically significant number of HCM patients and/or functional studies have not been performed to verify its pathogenicity. This VUS can be tested in the family to see if it segregates with disease. If a VUS is found, first-degree relatives should undergo regular clinical screening for HCM, but genetic testing for the VUS is otherwise not warranted.  If a pathogenic variant is reported, then first-degree family members can get tested for this variant. If they test positive, it is likely they will develop HCM. In light of variable expression and incomplete penetrance associated with HCM, it is not possible to predict when they will manifest clinically with HCM. It is recommended that family members that test positive for the familial pathogenic variant pursue clinical screening for HCM. Family members that test negative for the familial mutation need not pursue periodic screening for HCM, but seek care if symptoms develop.   Impression  Kelly Moran was found to have cardiac wall thickness suggestive of HCM at age 13 in the presence of other cardiac loading condition such as HTN that can lead to cardiac hypertrophy. There is no family history of HCM but reports sudden death in a paternal second-degree relative. It is likely that she either has a de novo mutation for HCM or has inherited this from one of her parents.  Genetic testing is recommended to confirm his diagnosis. This test should include the major  sarcomeric genes involved in HCM, namely MYBPPC3, MYH7, TNNI3, TNNT2, TPM1, ACTC1, MYL2 and MYL3. It should also include the genes involved in HCM phenocopies as cardiac-predominant forms of these conditions present clinically as HCM. These include genes for Fabry disease (GLA), Danon disease (LAMP2), WPW syndrome (PRKAG2), Familial transthyretin amyloidosis (TTR) and phospholamban (PLN).  In addition, patient should be aware of protections afforded by the Genetic Information Non-Discrimination Act (GINA). GINA protects a patient from losing their employment or health insurance based on their genotype. However, these protections do not cover life insurance and disability. Explained to the patient that family members that are found to have the familial genetic mutation will be denied life insurance even if they are asymptomatic and do not exhibit clinical signs of HCM. She verbalized understanding and will discuss this with her children.   Please note that the patient has not been counseled in this visit on other personal, cultural or ethical issues that she may face due to her heart condition.   Plan After a thorough discussion of the risk and benefits of genetic testing for HCM, Kelly Moran expresses interest in pursuing this after discussing with her family. She plans to pursue genetic testing later and will reach out to [place] when she is ready.  Meanwhile she will discuss HCM  screening guidelines and procuring life insurance with her kids.   Sidney Ace, Ph.D, [facility] Clinical Molecular Geneticist

## 2023-10-28 ENCOUNTER — Telehealth: Payer: Self-pay

## 2023-10-28 DIAGNOSIS — Z0000 Encounter for general adult medical examination without abnormal findings: Secondary | ICD-10-CM

## 2023-10-28 NOTE — Telephone Encounter (Signed)
 Called patient per health coaching referral for smoking cessation from Dr. Lawerance. Went straight to [employer]. Left message for patient to return call to discuss health coaching and her interest in participating in the program.   Greig Ruth, MS, ERHD, [facility]  Care Guide, Health & Wellness Coach [address] [place] [address] Telephone: [phone] Email: [email or web address]

## 2023-11-12 ENCOUNTER — Ambulatory Visit: Payer: Managed Care, Other (non HMO) | Attending: Cardiology | Admitting: Cardiology

## 2023-11-12 ENCOUNTER — Telehealth: Payer: Self-pay

## 2023-11-12 ENCOUNTER — Encounter: Payer: Self-pay | Admitting: Cardiology

## 2023-11-12 VITALS — BP 130/88 | HR 78 | Ht 65.0 in | Wt 233.8 lb

## 2023-11-12 DIAGNOSIS — Z0000 Encounter for general adult medical examination without abnormal findings: Secondary | ICD-10-CM

## 2023-11-12 DIAGNOSIS — F17200 Nicotine dependence, unspecified, uncomplicated: Secondary | ICD-10-CM | POA: Diagnosis not present

## 2023-11-12 DIAGNOSIS — E876 Hypokalemia: Secondary | ICD-10-CM

## 2023-11-12 DIAGNOSIS — I10 Essential (primary) hypertension: Secondary | ICD-10-CM | POA: Diagnosis not present

## 2023-11-12 DIAGNOSIS — I422 Other hypertrophic cardiomyopathy: Secondary | ICD-10-CM | POA: Diagnosis not present

## 2023-11-12 NOTE — Progress Notes (Unsigned)
 Cardiology Office Note:    Date:  11/12/2023   ID:  Kelly Moran, DOB 06-04-1981, MRN [id number]  PCP:  [facility]  Cardiologist:  Gypsy Balsam, MD    Referring MD: [facility]   Chief Complaint  Patient presents with   Follow-up    History of Present Illness:    Kelly Moran is a 43 y.o. female past medical history significant for hypertrophic cardiomyopathy, essential hypertension, significant mixed cavitary obliteration, she did have a monitor which likely did not show any arrhythmia she was referred to our hypertrophic cardiomyopathy clinic was very good visit she was recommended to do genetic testing she went to geneticist however when she learned about the fact that this cannot close for $2000 she decided not to pursue that.  She was also asked to do some additional blood test for potential hyperaldosteronism however she did not do this comes today to my office she says she is doing fine she denies have any chest pain tightness squeezing pressure burning chest her blood pressure seems to be well controlled now she did not make any changes recommended by hypertrophic cardiomyopathy clinic she is supposed to switch amlodipine and metoprolol to diltiazem however she did not do this.  She said she is does not want to do this because she feels good.  Past Medical History:  Diagnosis Date   Breast infection 02/10/2023   Chronic pain of left knee 01/07/2017   Dyspnea on exertion 04/22/2023   Essential hypertension 02/10/2023   Hypertension    LVH (left ventricular hypertrophy) 05/21/2023   Onychomycosis due to dermatophyte 03/04/2017   Smoking 04/22/2023   Tinea pedis of both feet 03/04/2017   Unspecified lump in the left breast, lower outer quadrant 02/10/2023    Past Surgical History:  Procedure Laterality Date   ABDOMINAL HYSTERECTOMY  2012   knee sugery Left    TONSILLECTOMY  2004   TUBAL LIGATION      Current Medications: Current  Meds  Medication Sig   cloNIDine (CATAPRES) 0.1 MG tablet Take 1 tablet by mouth 2 (two) times daily. Patient reports only takes 1 in the am and 1 in the evening depending on BP readings   hydrALAZINE (APRESOLINE) 10 MG tablet Take 1 tablet by mouth 2 (two) times daily. Patient reports only takes 1 in the am and 1 in the evening depending on BP readings   hydrochlorothiazide (HYDRODIURIL) 25 MG tablet Take 25 mg by mouth in the morning.     Allergies:   Atenolol, Aspirin, and Lisinopril   Social History   Socioeconomic History   Marital status: Single    Spouse name: Not on file   Number of children: Not on file   Years of education: Not on file   Highest education level: Not on file  Occupational History   Not on file  Tobacco Use   Smoking status: Every Day    Current packs/day: 0.50    Types: Cigarettes   Smokeless tobacco: Never  Substance and Sexual Activity   Alcohol use: Yes    Comment: Occasional   Drug use: Yes    Types: Marijuana   Sexual activity: Yes    Birth control/protection: Surgical  Other Topics Concern   Not on file  Social History Narrative   Not on file   Social Drivers of Health   Financial Resource Strain: Not on file  Food Insecurity: Not on file  Transportation Needs: Not on file  Physical Activity: Not  on file  Stress: Not on file  Social Connections: Not on file     Family History: The patient's family history includes Arthritis in her mother; Asthma in her mother; Diabetes in her father, mother, and sister; Hypertension in her father, mother, and sister. ROS:   Please see the history of present illness.    All 14 point review of systems negative except as described per history of present illness  EKGs/Labs/Other Studies Reviewed:         Recent Labs: 06/01/2023: Hemoglobin 14.5  Recent Lipid Panel No results found for: "CHOL", "TRIG", "HDL", "CHOLHDL", "VLDL", "LDLCALC", "LDLDIRECT"  Physical Exam:    VS:  BP 130/88 (BP  Location: Left Arm, Patient Position: Sitting)   Pulse 78   Ht 5\' 5"  (1.651 m)   Wt 233 lb 12.8 oz (106.1 kg)   SpO2 97%   BMI 38.91 kg/m     Wt Readings from Last 3 Encounters:  11/12/23 233 lb 12.8 oz (106.1 kg)  10/19/23 227 lb (103 kg)  08/12/23 224 lb 12.8 oz (102 kg)     GEN:  Well nourished, well developed in no acute distress HEENT: Normal NECK: No JVD; No carotid bruits LYMPHATICS: No lymphadenopathy CARDIAC: RRR, no murmurs, no rubs, no gallops RESPIRATORY:  Clear to auscultation without rales, wheezing or rhonchi  ABDOMEN: Soft, non-tender, non-distended MUSCULOSKELETAL:  No edema; No deformity  SKIN: Warm and dry LOWER EXTREMITIES: no swelling NEUROLOGIC:  Alert and oriented x 3 PSYCHIATRIC:  Normal affect   ASSESSMENT:    1. Hypertrophic cardiomyopathy (HCC)   2. Primary hypertension   3. Hypokalemia   4. Smoking    PLAN:    In order of problems listed above:  Hypertrophic cardiomyopathy with mid cavitary obstruction.  Overall seems to be doing well.  Sadly she did not do genetic testing.  She also did not follow instruction for changes medication she does not want to do it likely her blood pressure seems to well-controlled.  In terms of genetic testing I asked her to think it over because that would be very beneficial for her children she was also told if her genetic testing positive then testing for her children will be free.  She still does not want to do that. Smoking still ongoing which obviously problem. Essential hypertension well-controlled but I asked her to check blood pressure on the regular basis for next 2 weeks twice a day and then let me know what it is   Medication Adjustments/Labs and Tests Ordered: Current medicines are reviewed at length with the patient today.  Concerns regarding medicines are outlined above.  No orders of the defined types were placed in this encounter.  Medication changes: No orders of the defined types were placed in  this encounter.   Signed, Georgeanna Lea, MD, [facility] 11/12/2023 4:34 PM    [facility]

## 2023-11-12 NOTE — Patient Instructions (Signed)

## 2023-11-12 NOTE — Telephone Encounter (Signed)
 Called patient to discuss interest in participating in health coaching per referral from Dr. Izora Ribas for smoking cessation. Patient did not answer. Left message for patient to return call to discuss participating in program.   Renaee Munda, MS, ERHD, [facility]  Care Guide, Health & Wellness Coach [address] [place] [address] Telephone: [phone] Email: [email or web address]

## 2023-12-07 ENCOUNTER — Ambulatory Visit: Payer: Managed Care, Other (non HMO) | Admitting: Cardiology

## 2024-02-19 ENCOUNTER — Ambulatory Visit: Payer: Managed Care, Other (non HMO) | Admitting: Cardiology

## 2024-05-11 ENCOUNTER — Ambulatory Visit: Admitting: Cardiology

## 2024-12-27 ENCOUNTER — Ambulatory Visit: Payer: Self-pay | Admitting: Internal Medicine
